# Patient Record
Sex: Female | Born: 1945 | Race: White | Hispanic: No | Smoking: Never smoker
Health system: Southern US, Community
[De-identification: ages and names within clinical notes are randomized; demographics above are authoritative.]

## PROBLEM LIST (undated history)

## (undated) DIAGNOSIS — M659 Unspecified synovitis and tenosynovitis, unspecified site: Secondary | ICD-10-CM

## (undated) DIAGNOSIS — K589 Irritable bowel syndrome without diarrhea: Secondary | ICD-10-CM

## (undated) DIAGNOSIS — R569 Unspecified convulsions: Secondary | ICD-10-CM

## (undated) DIAGNOSIS — H9190 Unspecified hearing loss, unspecified ear: Secondary | ICD-10-CM

## (undated) DIAGNOSIS — M1990 Unspecified osteoarthritis, unspecified site: Secondary | ICD-10-CM

## (undated) DIAGNOSIS — R197 Diarrhea, unspecified: Secondary | ICD-10-CM

## (undated) DIAGNOSIS — G459 Transient cerebral ischemic attack, unspecified: Secondary | ICD-10-CM

## (undated) DIAGNOSIS — M722 Plantar fascial fibromatosis: Secondary | ICD-10-CM

## (undated) DIAGNOSIS — D219 Benign neoplasm of connective and other soft tissue, unspecified: Secondary | ICD-10-CM

## (undated) DIAGNOSIS — K8020 Calculus of gallbladder without cholecystitis without obstruction: Secondary | ICD-10-CM

## (undated) DIAGNOSIS — F329 Major depressive disorder, single episode, unspecified: Secondary | ICD-10-CM

## (undated) DIAGNOSIS — Z889 Allergy status to unspecified drugs, medicaments and biological substances status: Secondary | ICD-10-CM

## (undated) DIAGNOSIS — K649 Unspecified hemorrhoids: Secondary | ICD-10-CM

## (undated) DIAGNOSIS — F32A Depression, unspecified: Secondary | ICD-10-CM

## (undated) DIAGNOSIS — E039 Hypothyroidism, unspecified: Secondary | ICD-10-CM

## (undated) DIAGNOSIS — I639 Cerebral infarction, unspecified: Secondary | ICD-10-CM

## (undated) DIAGNOSIS — N200 Calculus of kidney: Secondary | ICD-10-CM

## (undated) HISTORY — PX: JOINT REPLACEMENT: SHX530

## (undated) HISTORY — PX: WISDOM TOOTH EXTRACTION: SHX21

## (undated) HISTORY — PX: BUNIONECTOMY: SHX129

## (undated) HISTORY — DX: Transient cerebral ischemic attack, unspecified: G45.9

## (undated) HISTORY — PX: DILATION AND CURETTAGE OF UTERUS: SHX78

## (undated) HISTORY — PX: CYST EXCISION: SHX5701

## (undated) HISTORY — PX: HARDWARE REMOVAL: SHX979

## (undated) HISTORY — PX: TONSILLECTOMY: SUR1361

## (undated) HISTORY — DX: Unspecified convulsions: R56.9

## (undated) HISTORY — PX: COLONOSCOPY: SHX174

---

## 2005-01-05 ENCOUNTER — Ambulatory Visit: Payer: Self-pay | Admitting: Obstetrics and Gynecology

## 2006-02-02 ENCOUNTER — Ambulatory Visit: Payer: Self-pay | Admitting: Obstetrics and Gynecology

## 2006-06-01 ENCOUNTER — Ambulatory Visit: Payer: Self-pay | Admitting: Podiatry

## 2006-08-18 ENCOUNTER — Ambulatory Visit: Payer: Self-pay | Admitting: Obstetrics and Gynecology

## 2007-02-06 ENCOUNTER — Ambulatory Visit: Payer: Self-pay | Admitting: Obstetrics and Gynecology

## 2007-11-27 ENCOUNTER — Ambulatory Visit: Payer: Self-pay | Admitting: Unknown Physician Specialty

## 2008-02-22 ENCOUNTER — Ambulatory Visit: Payer: Self-pay | Admitting: Obstetrics and Gynecology

## 2009-02-25 ENCOUNTER — Ambulatory Visit: Payer: Self-pay | Admitting: Obstetrics and Gynecology

## 2010-03-03 ENCOUNTER — Ambulatory Visit: Payer: Self-pay | Admitting: Obstetrics and Gynecology

## 2011-03-05 ENCOUNTER — Ambulatory Visit: Payer: Self-pay | Admitting: Obstetrics and Gynecology

## 2011-08-03 ENCOUNTER — Ambulatory Visit: Payer: Self-pay | Admitting: Unknown Physician Specialty

## 2012-03-08 ENCOUNTER — Ambulatory Visit: Payer: Self-pay | Admitting: Obstetrics and Gynecology

## 2013-01-31 ENCOUNTER — Ambulatory Visit: Payer: Self-pay

## 2013-02-06 ENCOUNTER — Ambulatory Visit: Payer: Self-pay | Admitting: Family Medicine

## 2013-03-08 ENCOUNTER — Ambulatory Visit: Payer: Self-pay | Admitting: Family Medicine

## 2015-01-13 ENCOUNTER — Encounter (INDEPENDENT_AMBULATORY_CARE_PROVIDER_SITE_OTHER): Payer: Medicare Other | Admitting: Ophthalmology

## 2015-01-13 DIAGNOSIS — H33303 Unspecified retinal break, bilateral: Secondary | ICD-10-CM | POA: Diagnosis not present

## 2015-01-13 DIAGNOSIS — H4313 Vitreous hemorrhage, bilateral: Secondary | ICD-10-CM | POA: Diagnosis not present

## 2015-01-13 DIAGNOSIS — H4311 Vitreous hemorrhage, right eye: Secondary | ICD-10-CM | POA: Diagnosis not present

## 2015-01-13 DIAGNOSIS — H2513 Age-related nuclear cataract, bilateral: Secondary | ICD-10-CM | POA: Diagnosis not present

## 2015-01-20 ENCOUNTER — Ambulatory Visit (INDEPENDENT_AMBULATORY_CARE_PROVIDER_SITE_OTHER): Payer: Medicare Other | Admitting: Ophthalmology

## 2015-01-20 DIAGNOSIS — H33302 Unspecified retinal break, left eye: Secondary | ICD-10-CM

## 2015-01-30 ENCOUNTER — Ambulatory Visit (INDEPENDENT_AMBULATORY_CARE_PROVIDER_SITE_OTHER): Payer: Medicare Other | Admitting: Ophthalmology

## 2015-01-30 DIAGNOSIS — H33303 Unspecified retinal break, bilateral: Secondary | ICD-10-CM

## 2015-03-25 ENCOUNTER — Other Ambulatory Visit: Payer: Self-pay | Admitting: Family Medicine

## 2015-03-25 DIAGNOSIS — Z780 Asymptomatic menopausal state: Secondary | ICD-10-CM

## 2015-04-02 DIAGNOSIS — M810 Age-related osteoporosis without current pathological fracture: Secondary | ICD-10-CM | POA: Insufficient documentation

## 2015-04-02 DIAGNOSIS — K8020 Calculus of gallbladder without cholecystitis without obstruction: Secondary | ICD-10-CM | POA: Insufficient documentation

## 2015-06-02 ENCOUNTER — Ambulatory Visit (INDEPENDENT_AMBULATORY_CARE_PROVIDER_SITE_OTHER): Payer: Medicare Other | Admitting: Ophthalmology

## 2015-06-02 DIAGNOSIS — H43813 Vitreous degeneration, bilateral: Secondary | ICD-10-CM

## 2015-06-02 DIAGNOSIS — H2513 Age-related nuclear cataract, bilateral: Secondary | ICD-10-CM | POA: Diagnosis not present

## 2015-06-02 DIAGNOSIS — H33303 Unspecified retinal break, bilateral: Secondary | ICD-10-CM

## 2015-06-05 ENCOUNTER — Other Ambulatory Visit: Payer: Self-pay | Admitting: Family Medicine

## 2015-06-05 DIAGNOSIS — Z1231 Encounter for screening mammogram for malignant neoplasm of breast: Secondary | ICD-10-CM

## 2015-07-03 ENCOUNTER — Ambulatory Visit
Admission: RE | Admit: 2015-07-03 | Discharge: 2015-07-03 | Disposition: A | Discharge status: Home / Self Care | Payer: Medicare Other | Source: Ambulatory Visit | Attending: Family Medicine | Admitting: Family Medicine

## 2015-07-03 DIAGNOSIS — Z1231 Encounter for screening mammogram for malignant neoplasm of breast: Secondary | ICD-10-CM | POA: Diagnosis not present

## 2016-02-03 ENCOUNTER — Emergency Department
Admission: EM | Admit: 2016-02-03 | Discharge: 2016-02-03 | Disposition: A | Discharge status: Home / Self Care | Payer: Medicare Other | Attending: Emergency Medicine | Admitting: Emergency Medicine

## 2016-02-03 ENCOUNTER — Encounter: Payer: Self-pay | Admitting: Emergency Medicine

## 2016-02-03 ENCOUNTER — Emergency Department: Payer: Medicare Other

## 2016-02-03 DIAGNOSIS — Z7982 Long term (current) use of aspirin: Secondary | ICD-10-CM | POA: Diagnosis not present

## 2016-02-03 DIAGNOSIS — J189 Pneumonia, unspecified organism: Secondary | ICD-10-CM

## 2016-02-03 DIAGNOSIS — Z79899 Other long term (current) drug therapy: Secondary | ICD-10-CM | POA: Diagnosis not present

## 2016-02-03 DIAGNOSIS — J159 Unspecified bacterial pneumonia: Secondary | ICD-10-CM | POA: Insufficient documentation

## 2016-02-03 DIAGNOSIS — R05 Cough: Secondary | ICD-10-CM | POA: Diagnosis present

## 2016-02-03 LAB — BASIC METABOLIC PANEL
ANION GAP: 8 (ref 5–15)
BUN: 17 mg/dL (ref 6–20)
CHLORIDE: 107 mmol/L (ref 101–111)
CO2: 23 mmol/L (ref 22–32)
Calcium: 9 mg/dL (ref 8.9–10.3)
Creatinine, Ser: 0.69 mg/dL (ref 0.44–1.00)
GFR calc Af Amer: >60 mL/min (ref >60)
GLUCOSE: 152 mg/dL — AB (ref 65–99)
POTASSIUM: 2.9 mmol/L — AB (ref 3.5–5.1)
Sodium: 138 mmol/L (ref 135–145)

## 2016-02-03 LAB — POCT RAPID STREP A: Streptococcus, Group A Screen (Direct): NEGATIVE

## 2016-02-03 LAB — CBC
HEMATOCRIT: 39.6 % (ref 35.0–47.0)
HEMOGLOBIN: 13.3 g/dL (ref 12.0–16.0)
MCH: 30.4 pg (ref 26.0–34.0)
MCHC: 33.6 g/dL (ref 32.0–36.0)
MCV: 90.3 fL (ref 80.0–100.0)
PLATELETS: 249 10*3/uL (ref 150–440)
RBC: 4.39 MIL/uL (ref 3.80–5.20)
RDW: 14.1 % (ref 11.5–14.5)
WBC: 11.2 10*3/uL — AB (ref 3.6–11.0)

## 2016-02-03 LAB — RAPID INFLUENZA A&B ANTIGENS: Influenza A (ARMC): NEGATIVE

## 2016-02-03 LAB — RAPID INFLUENZA A&B ANTIGENS (ARMC ONLY): INFLUENZA B (ARMC): NEGATIVE

## 2016-02-03 MED ORDER — IPRATROPIUM-ALBUTEROL 0.5-2.5 (3) MG/3ML IN SOLN
3.0000 mL | Freq: Once | RESPIRATORY_TRACT | Status: AC
  Administered 2016-02-03: 3 mL via RESPIRATORY_TRACT
  Filled 2016-02-03: qty 3

## 2016-02-03 MED ORDER — ONDANSETRON 4 MG PO TBDP: 4.0000 mg | ORAL_TABLET | Freq: Once | ORAL | Status: DC

## 2016-02-03 MED ORDER — AZITHROMYCIN 250 MG PO TABS: 250.0000 mg | ORAL_TABLET | Freq: Every day | ORAL | Status: DC

## 2016-02-03 MED ORDER — AZITHROMYCIN 500 MG PO TABS
500.0000 mg | ORAL_TABLET | Freq: Once | ORAL | Status: AC
  Administered 2016-02-03: 500 mg via ORAL
  Filled 2016-02-03: qty 1

## 2016-02-03 MED ORDER — ONDANSETRON 4 MG PO TBDP
4.0000 mg | ORAL_TABLET | Freq: Once | ORAL | Status: AC
  Administered 2016-02-03: 4 mg via ORAL
  Filled 2016-02-03: qty 1

## 2016-02-03 NOTE — ED Notes (Signed)
Pt states she has been coughing and feeling poorly since last Thursday (5 days ago).  Pt has also been running a fever at home (tmax at home 101.9).  Pt went to ENT Dr. Tami Ribas this morning and he recommended she come to ED to be evaluated for pneumonia.  Pt not in any obvious signs of distress or shortness of breath.  Denies pain.

## 2016-02-03 NOTE — Discharge Instructions (Signed)
Please drink plenty of fluid to stay well-hydrated. Please return to the emergency department if you develop severe pain, shortness of breath, lightheadedness or fainting, fever, chest pain, or any other symptoms concerning to you. Community-Acquired Pneumonia, Adult Pneumonia is an infection of the lungs. There are different types of pneumonia. One type can develop while a person is in a hospital. A different type, called community-acquired pneumonia, develops in people who are not, or have not recently been, in the hospital or other health care facility.  CAUSES Pneumonia may be caused by bacteria, viruses, or funguses. Community-acquired pneumonia is often caused by Streptococcus pneumonia bacteria. These bacteria are often passed from one person to another by breathing in droplets from the cough or sneeze of an infected person. RISK FACTORS The condition is more likely to develop in:  People who havechronic diseases, such as chronic obstructive pulmonary disease (COPD), asthma, congestive heart failure, cystic fibrosis, diabetes, or kidney disease.  People who haveearly-stage or late-stage HIV.  People who havesickle cell disease.  People who havehad their spleen removed (splenectomy).  People who havepoor Human resources officer.  People who havemedical conditions that increase the risk of breathing in (aspirating) secretions their own mouth and nose.   People who havea weakened immune system (immunocompromised).  People who smoke.  People whotravel to areas where pneumonia-causing germs commonly exist.  People whoare around animal habitats or animals that have pneumonia-causing germs, including birds, bats, rabbits, cats, and farm animals. SYMPTOMS Symptoms of this condition include:  Adry cough.  A wet (productive) cough.  Fever.  Sweating.  Chest pain, especially when breathing deeply or coughing.  Rapid breathing or difficulty breathing.  Shortness of  breath.  Shaking chills.  Fatigue.  Muscle aches. DIAGNOSIS Your health care provider will take a medical history and perform a physical exam. You may also have other tests, including:  Imaging studies of your chest, including X-rays.  Tests to check your blood oxygen level and other blood gases.  Other tests on blood, mucus (sputum), fluid around your lungs (pleural fluid), and urine. If your pneumonia is severe, other tests may be done to identify the specific cause of your illness. TREATMENT The type of treatment that you receive depends on many factors, such as the cause of your pneumonia, the medicines you take, and other medical conditions that you have. For most adults, treatment and recovery from pneumonia may occur at home. In some cases, treatment must happen in a hospital. Treatment may include:  Antibiotic medicines, if the pneumonia was caused by bacteria.  Antiviral medicines, if the pneumonia was caused by a virus.  Medicines that are given by mouth or through an IV tube.  Oxygen.  Respiratory therapy. Although rare, treating severe pneumonia may include:  Mechanical ventilation. This is done if you are not breathing well on your own and you cannot maintain a safe blood oxygen level.  Thoracentesis. This procedureremoves fluid around one lung or both lungs to help you breathe better. HOME CARE INSTRUCTIONS  Take over-the-counter and prescription medicines only as told by your health care provider.  Only takecough medicine if you are losing sleep. Understand that cough medicine can prevent your body's natural ability to remove mucus from your lungs.  If you were prescribed an antibiotic medicine, take it as told by your health care provider. Do not stop taking the antibiotic even if you start to feel better.  Sleep in a semi-upright position at night. Try sleeping in a reclining chair, or place a  few pillows under your head.  Do not use tobacco products,  including cigarettes, chewing tobacco, and e-cigarettes. If you need help quitting, ask your health care provider.  Drink enough water to keep your urine clear or pale yellow. This will help to thin out mucus secretions in your lungs. PREVENTION There are ways that you can decrease your risk of developing community-acquired pneumonia. Consider getting a pneumococcal vaccine if:  You are older than 71 years of age.  You are older than 71 years of age and are undergoing cancer treatment, have chronic lung disease, or have other medical conditions that affect your immune system. Ask your health care provider if this applies to you. There are different types and schedules of pneumococcal vaccines. Ask your health care provider which vaccination option is best for you. You may also prevent community-acquired pneumonia if you take these actions:  Get an influenza vaccine every year. Ask your health care provider which type of influenza vaccine is best for you.  Go to the dentist on a regular basis.  Wash your hands often. Use hand sanitizer if soap and water are not available. SEEK MEDICAL CARE IF:  You have a fever.  You are losing sleep because you cannot control your cough with cough medicine. SEEK IMMEDIATE MEDICAL CARE IF:  You have worsening shortness of breath.  You have increased chest pain.  Your sickness becomes worse, especially if you are an older adult or have a weakened immune system.  You cough up blood.   This information is not intended to replace advice given to you by your health care provider. Make sure you discuss any questions you have with your health care provider.   Document Released: 10/25/2005 Document Revised: 07/16/2015 Document Reviewed: 02/19/2015 Elsevier Interactive Patient Education [employer].

## 2016-02-03 NOTE — ED Notes (Signed)
Pt states cough, fever, and feeling bad since Sat.

## 2016-02-03 NOTE — ED Provider Notes (Signed)
[facility] Emergency Department Provider Note  ____________________________________________  Time seen: Approximately 10:27 AM  I have reviewed the triage vital signs and the nursing notes.   HISTORY  Chief Complaint Cough and Fever    HPI [name] [name] is a 70 y.o. female without any underlying lung disease presenting from ENT clinic for cough and fever. Patient reports that the last 4 days she has had a fever as high as 101.9. She has had a productive cough associated with sore throat, and congestion. She is not had any nausea vomiting or diarrhea, no abdominal pain. No chest pain, palpitations lightheadedness or syncope. She does have some mild exertional dyspnea.   History reviewed. No pertinent past medical history.  There are no active problems to display for this patient.   Past Surgical History  Procedure Laterality Date  . Tonsillectomy      Current Outpatient Rx  Name  Route  Sig  Dispense  Refill  . aspirin 81 MG chewable tablet   Oral   Chew 1 tablet by mouth at bedtime.         . Azelastine-Fluticasone (DYMISTA) 137-50 MCG/ACT SUSP   Each Nare   Place 1 spray into both nostrils 2 (two) times daily.         . Calcium Carbonate-Vitamin D 600-400 MG-UNIT tablet   Oral   Take 1 tablet by mouth daily.         . cetirizine (ZYRTEC) 10 MG tablet   Oral   Take 1 tablet by mouth daily.         . cholecalciferol (VITAMIN D) 1000 units tablet   Oral   Take 1,000 Units by mouth daily.         . Coenzyme Q-10 200 MG CAPS   Oral   Take 1 capsule by mouth daily.         . Cyanocobalamin (VITAMIN B-12 CR) 1000 MCG TBCR   Oral   Take 1 tablet by mouth daily.         Marland Kitchen EPIPEN 2-PAK 0.3 MG/0.3ML SOAJ injection   Intramuscular   Inject 0.3 mg into the muscle as needed.      3     Dispense as written.   . levocetirizine (XYZAL) 5 MG tablet   Oral   Take 1 tablet by mouth at bedtime.         . NONFORMULARY OR  COMPOUNDED ITEM   Intramuscular   Inject into the muscle once a week. Compounded allergy shot.         . pyridOXINE (VITAMIN B-6) 25 MG tablet   Oral   Take 1 tablet by mouth daily.         . Red Yeast Rice Extract 600 MG CAPS   Oral   Take 1 capsule by mouth 2 (two) times daily.         Marland Kitchen azithromycin (ZITHROMAX) 250 MG tablet   Oral   Take 1 tablet (250 mg total) by mouth daily.   4 tablet   0   . ondansetron (ZOFRAN-ODT) 4 MG disintegrating tablet   Oral   Take 1 tablet (4 mg total) by mouth once.   20 tablet   0     Allergies Augmentin; Prednisone; Codeine; and Erythromycin  Family History  Problem Relation Age of Onset  . Breast cancer Maternal Grandfather 80    Social History Social History  Substance Use Topics  . Smoking status: Never Smoker   .  Smokeless tobacco: None  . Alcohol Use: No     Comment: occas    Review of Systems Constitutional: Positive fever. Positive general malaise. Negative lightheadedness or syncope. Eyes: No visual changes. No eye discharge. ENT: Positive sore throat. Positive congestion or rhinorrhea. Cardiovascular: Denies chest pain. Denies palpitations. Respiratory: Positive exertional shortness of breath.  Positive cough. Gastrointestinal: No abdominal pain.  No nausea, no vomiting.  No diarrhea.  No constipation. Genitourinary: Negative for dysuria. Musculoskeletal: Negative for back pain. Skin: Negative for rash. Neurological: Negative for headaches. No focal numbness, tingling or weakness.   10-point ROS otherwise negative.  ____________________________________________   PHYSICAL EXAM:  VITAL SIGNS: ED Triage Vitals  Enc Vitals Group     BP 02/03/16 0919 147/70 mmHg     Pulse Rate 02/03/16 0919 91     Resp 02/03/16 0919 18     Temp 02/03/16 0919 98.2 F (36.8 C)     Temp Source 02/03/16 0919 Oral     SpO2 02/03/16 0919 100 %     Weight 02/03/16 0919 164 lb (74.39 kg)     Height 02/03/16 0919 5\' 4"   (1.626 m)     Head Cir --      Peak Flow --      Pain Score --      Pain Loc --      Pain Edu? --      Excl. in [place]? --     Constitutional: Patient is alert and oriented and answering questions appropriately. She is in no acute distress but mildly uncomfortable appearing.  Eyes: Conjunctivae are normal.  EOMI. No scleral icterus. No eye discharge. Head: Atraumatic. Nose: Positive congestion without rhinorrhea. Mouth/Throat: Mucous membranes are moist. No posterior pharyngeal erythema, tonsils are absent. Neck: No stridor.  Supple.  No meningismus. Cardiovascular: Normal rate, regular rhythm. No murmurs, rubs or gallops.  Respiratory: Patient is mildly tachypnea without accessory muscle use or retractions. She has some rales in the left but otherwise no wheezing or rhonchi.  Gastrointestinal: Soft, nontender and nondistended.  No guarding or rebound.  No peritoneal signs. Musculoskeletal: No LE edema. No ttp in the calves or palpable cords.  Negative Homan's sign. Neurologic:  A&Ox3.  Speech is clear.  Face and smile are symmetric.  EOMI.  Moves all extremities well. Skin:  Skin is warm, dry and intact. No rash noted. Psychiatric: Mood and affect are normal. Speech and behavior are normal.  Normal judgement.  ____________________________________________   LABS (all labs ordered are listed, but only abnormal results are displayed)  Labs Reviewed  CBC - Abnormal; Notable for the following:    WBC 11.2 (*)    All other components within normal limits  BASIC METABOLIC PANEL - Abnormal; Notable for the following:    Potassium 2.9 (*)    Glucose, Bld 152 (*)    All other components within normal limits  RAPID INFLUENZA A&B ANTIGENS ([facility] ONLY)  POCT RAPID STREP A   ____________________________________________  EKG  ED ECG REPORT I, Eula Listen, the attending physician, personally viewed and interpreted this ECG.   Date: 02/03/2016  EKG Time: 1221  Rate: 94  Rhythm:  normal sinus rhythm  Axis: Leftward  Intervals:Prolonged QTC  ST&T Change: No ST elevation  ____________________________________________  RADIOLOGY  Dg Chest 2 View  02/03/2016  CLINICAL DATA:  Cough and fever for several days EXAM: CHEST  2 VIEW COMPARISON:  02/06/2013 FINDINGS: Cardiac shadow is within normal limits. The lungs are well aerated bilaterally. Mild  left lower lobe infiltrate is seen. No associated effusion is noted. No bony abnormality is noted. IMPRESSION: Mild left lower lobe infiltrate. Electronically Signed   By: Inez Catalina M.D.   On: 02/03/2016 10:12    ____________________________________________   PROCEDURES  Procedure(s) performed: None  Critical Care performed: No ____________________________________________   INITIAL IMPRESSION / ASSESSMENT AND PLAN / ED COURSE  Pertinent labs & imaging results that were available during my care of the patient were reviewed by me and considered in my medical decision making (see chart for details).  70 y.o. female presenting with 4 days of fever, cough and has an abnormal left lower lung base on exam. I am concerned the patient may have pneumonia, but we will also check her for influenza. At rest, she has a normal oxygen saturation and no significant respiratory distress, so she is able to maintain her oxygen saturations with exertion, she will be able to be treated for pneumonia as an outpatient with close PMD follow-up. It is very unlikely that her symptoms come from an acute cardiac cause.  ----------------------------------------- 10:30 AM on 02/03/2016 -----------------------------------------  On chest x-ray, the patient does have a left lower lobe pneumonia.  ----------------------------------------- 12:09 PM on 02/03/2016 -----------------------------------------  She does continue to maintain normal oxygen saturation. She denied had a long conversation about the sensitivities that she has had antibiotics in  the past, but she states that she does well with azithromycin. We will plan to discharge her home with this as well as Zofran to take if she develops nausea. She will follow-up with her primary care physician. I did call Dr. Tami Ribas back to let him know the results of her studies.  ____________________________________________  FINAL CLINICAL IMPRESSION(S) / ED DIAGNOSES  Final diagnoses:  CAP (community acquired pneumonia)      NEW MEDICATIONS STARTED DURING THIS VISIT:  New Prescriptions   AZITHROMYCIN (ZITHROMAX) 250 MG TABLET    Take 1 tablet (250 mg total) by mouth daily.   ONDANSETRON (ZOFRAN-ODT) 4 MG DISINTEGRATING TABLET    Take 1 tablet (4 mg total) by mouth once.     Eula Listen, MD 02/03/16 1551

## 2016-02-24 ENCOUNTER — Ambulatory Visit
Admission: RE | Admit: 2016-02-24 | Discharge: 2016-02-24 | Disposition: A | Discharge status: Home / Self Care | Payer: Medicare Other | Source: Ambulatory Visit | Attending: Family Medicine | Admitting: Family Medicine

## 2016-02-24 ENCOUNTER — Other Ambulatory Visit: Payer: Self-pay | Admitting: Family Medicine

## 2016-02-24 DIAGNOSIS — J181 Lobar pneumonia, unspecified organism: Principal | ICD-10-CM

## 2016-02-24 DIAGNOSIS — Z09 Encounter for follow-up examination after completed treatment for conditions other than malignant neoplasm: Secondary | ICD-10-CM | POA: Diagnosis present

## 2016-02-24 DIAGNOSIS — J189 Pneumonia, unspecified organism: Secondary | ICD-10-CM

## 2016-02-24 DIAGNOSIS — Z8701 Personal history of pneumonia (recurrent): Secondary | ICD-10-CM | POA: Insufficient documentation

## 2016-06-02 ENCOUNTER — Ambulatory Visit (INDEPENDENT_AMBULATORY_CARE_PROVIDER_SITE_OTHER): Payer: Medicare Other | Admitting: Ophthalmology

## 2016-06-17 ENCOUNTER — Ambulatory Visit (INDEPENDENT_AMBULATORY_CARE_PROVIDER_SITE_OTHER): Payer: Medicare Other | Admitting: Ophthalmology

## 2016-06-17 DIAGNOSIS — H2513 Age-related nuclear cataract, bilateral: Secondary | ICD-10-CM

## 2016-06-17 DIAGNOSIS — H33303 Unspecified retinal break, bilateral: Secondary | ICD-10-CM | POA: Diagnosis not present

## 2016-06-17 DIAGNOSIS — H43813 Vitreous degeneration, bilateral: Secondary | ICD-10-CM

## 2016-08-24 ENCOUNTER — Other Ambulatory Visit: Payer: Self-pay | Admitting: Family Medicine

## 2016-08-24 DIAGNOSIS — Z1231 Encounter for screening mammogram for malignant neoplasm of breast: Secondary | ICD-10-CM

## 2016-09-24 ENCOUNTER — Ambulatory Visit
Admission: RE | Admit: 2016-09-24 | Discharge: 2016-09-24 | Disposition: A | Discharge status: Home / Self Care | Payer: Medicare Other | Source: Ambulatory Visit | Attending: Family Medicine | Admitting: Family Medicine

## 2016-09-24 DIAGNOSIS — Z1231 Encounter for screening mammogram for malignant neoplasm of breast: Secondary | ICD-10-CM | POA: Insufficient documentation

## 2017-09-21 DIAGNOSIS — E559 Vitamin D deficiency, unspecified: Secondary | ICD-10-CM | POA: Insufficient documentation

## 2017-10-13 ENCOUNTER — Other Ambulatory Visit: Payer: Self-pay | Admitting: Family Medicine

## 2017-10-13 DIAGNOSIS — Z1231 Encounter for screening mammogram for malignant neoplasm of breast: Secondary | ICD-10-CM

## 2017-11-25 ENCOUNTER — Ambulatory Visit
Admission: RE | Admit: 2017-11-25 | Discharge: 2017-11-25 | Disposition: A | Discharge status: Home / Self Care | Payer: Medicare Other | Source: Ambulatory Visit | Attending: Family Medicine | Admitting: Family Medicine

## 2017-11-25 DIAGNOSIS — Z1231 Encounter for screening mammogram for malignant neoplasm of breast: Secondary | ICD-10-CM | POA: Diagnosis present

## 2018-01-13 DIAGNOSIS — K580 Irritable bowel syndrome with diarrhea: Secondary | ICD-10-CM | POA: Insufficient documentation

## 2018-04-07 ENCOUNTER — Encounter: Payer: Self-pay | Admitting: *Deleted

## 2018-04-10 ENCOUNTER — Ambulatory Visit: Payer: Medicare Other | Admitting: Anesthesiology

## 2018-04-10 ENCOUNTER — Ambulatory Visit
Admission: RE | Admit: 2018-04-10 | Discharge: 2018-04-10 | Disposition: A | Discharge status: Home / Self Care | Payer: Medicare Other | Source: Ambulatory Visit | Attending: Unknown Physician Specialty | Admitting: Unknown Physician Specialty

## 2018-04-10 ENCOUNTER — Encounter
Admission: RE | Disposition: A | Discharge status: Home / Self Care | Payer: Self-pay | Source: Ambulatory Visit | Attending: Unknown Physician Specialty

## 2018-04-10 DIAGNOSIS — Z79899 Other long term (current) drug therapy: Secondary | ICD-10-CM | POA: Insufficient documentation

## 2018-04-10 DIAGNOSIS — F329 Major depressive disorder, single episode, unspecified: Secondary | ICD-10-CM | POA: Insufficient documentation

## 2018-04-10 DIAGNOSIS — E039 Hypothyroidism, unspecified: Secondary | ICD-10-CM | POA: Diagnosis not present

## 2018-04-10 DIAGNOSIS — K635 Polyp of colon: Secondary | ICD-10-CM | POA: Diagnosis not present

## 2018-04-10 DIAGNOSIS — Z7982 Long term (current) use of aspirin: Secondary | ICD-10-CM | POA: Insufficient documentation

## 2018-04-10 DIAGNOSIS — K640 First degree hemorrhoids: Secondary | ICD-10-CM | POA: Diagnosis not present

## 2018-04-10 DIAGNOSIS — Z1211 Encounter for screening for malignant neoplasm of colon: Secondary | ICD-10-CM | POA: Diagnosis present

## 2018-04-10 HISTORY — DX: Benign neoplasm of connective and other soft tissue, unspecified: D21.9

## 2018-04-10 HISTORY — DX: Plantar fascial fibromatosis: M72.2

## 2018-04-10 HISTORY — DX: Unspecified hearing loss, unspecified ear: H91.90

## 2018-04-10 HISTORY — DX: Synovitis and tenosynovitis, unspecified: M65.9

## 2018-04-10 HISTORY — DX: Unspecified synovitis and tenosynovitis, unspecified site: M65.90

## 2018-04-10 HISTORY — DX: Unspecified osteoarthritis, unspecified site: M19.90

## 2018-04-10 HISTORY — DX: Hypothyroidism, unspecified: E03.9

## 2018-04-10 HISTORY — DX: Unspecified hemorrhoids: K64.9

## 2018-04-10 HISTORY — DX: Irritable bowel syndrome, unspecified: K58.9

## 2018-04-10 HISTORY — PX: COLONOSCOPY WITH PROPOFOL: SHX5780

## 2018-04-10 HISTORY — DX: Allergy status to unspecified drugs, medicaments and biological substances: Z88.9

## 2018-04-10 HISTORY — DX: Diarrhea, unspecified: R19.7

## 2018-04-10 HISTORY — DX: Calculus of gallbladder without cholecystitis without obstruction: K80.20

## 2018-04-10 HISTORY — DX: Depression, unspecified: F32.A

## 2018-04-10 HISTORY — DX: Major depressive disorder, single episode, unspecified: F32.9

## 2018-04-10 HISTORY — DX: Calculus of kidney: N20.0

## 2018-04-10 SURGERY — COLONOSCOPY WITH PROPOFOL
Anesthesia: General

## 2018-04-10 MED ORDER — SODIUM CHLORIDE 0.9 % IV SOLN: INTRAVENOUS | Status: DC

## 2018-04-10 MED ORDER — LIDOCAINE HCL (PF) 2 % IJ SOLN
INTRAMUSCULAR | Status: DC | PRN
  Administered 2018-04-10: 60 mg

## 2018-04-10 MED ORDER — PROPOFOL 500 MG/50ML IV EMUL
INTRAVENOUS | Status: DC | PRN
  Administered 2018-04-10: 50 ug/kg/min via INTRAVENOUS

## 2018-04-10 MED ORDER — MIDAZOLAM HCL 2 MG/2ML IJ SOLN
INTRAMUSCULAR | Status: AC
  Filled 2018-04-10: qty 2

## 2018-04-10 MED ORDER — MIDAZOLAM HCL 5 MG/5ML IJ SOLN
INTRAMUSCULAR | Status: DC | PRN
Start: 2018-04-10 — End: 2018-04-10
  Administered 2018-04-10: 2 mg via INTRAVENOUS

## 2018-04-10 MED ORDER — FENTANYL CITRATE (PF) 100 MCG/2ML IJ SOLN
INTRAMUSCULAR | Status: DC | PRN
  Administered 2018-04-10: 25 ug via INTRAVENOUS

## 2018-04-10 MED ORDER — ATROPINE SULFATE 0.4 MG/ML IJ SOLN
INTRAMUSCULAR | Status: AC
  Filled 2018-04-10: qty 1

## 2018-04-10 MED ORDER — PROPOFOL 10 MG/ML IV BOLUS
INTRAVENOUS | Status: DC | PRN
  Administered 2018-04-10: 20 mg via INTRAVENOUS

## 2018-04-10 MED ORDER — GLYCOPYRROLATE 0.2 MG/ML IJ SOLN
INTRAMUSCULAR | Status: DC | PRN
  Administered 2018-04-10 (×2): 0.2 mg via INTRAVENOUS

## 2018-04-10 MED ORDER — SODIUM CHLORIDE 0.9 % IV SOLN
INTRAVENOUS | Status: DC
  Administered 2018-04-10: 1000 mL via INTRAVENOUS

## 2018-04-10 MED ORDER — GLYCOPYRROLATE 0.2 MG/ML IJ SOLN
INTRAMUSCULAR | Status: AC
  Filled 2018-04-10: qty 2

## 2018-04-10 MED ORDER — FENTANYL CITRATE (PF) 100 MCG/2ML IJ SOLN
INTRAMUSCULAR | Status: AC
  Filled 2018-04-10: qty 2

## 2018-04-10 MED ORDER — LIDOCAINE HCL (PF) 2 % IJ SOLN
INTRAMUSCULAR | Status: AC
  Filled 2018-04-10: qty 10

## 2018-04-10 NOTE — Transfer of Care (Signed)
Immediate Anesthesia Transfer of Care Note  Patient: [name] [name]  Procedure(s) Performed: COLONOSCOPY WITH PROPOFOL (N/A )  Patient Location: PACU  Anesthesia Type:General  Level of Consciousness: sedated  Airway & Oxygen Therapy: Patient Spontanous Breathing and Patient connected to nasal cannula oxygen  Post-op Assessment: Report given to RN and Post -op Vital signs reviewed and stable  Post vital signs: Reviewed and stable  Last Vitals:  Vitals Value Taken Time  BP 120/70 04/10/2018 10:30 AM  Temp 36.4 C 04/10/2018 10:30 AM  Pulse 95 04/10/2018 10:31 AM  Resp 20 04/10/2018 10:31 AM  SpO2 100 % 04/10/2018 10:31 AM  Vitals shown include unvalidated device data.  Last Pain:  Vitals:   04/10/18 1030  TempSrc: Tympanic  PainSc: Asleep         Complications: No apparent anesthesia complications

## 2018-04-10 NOTE — Anesthesia Post-op Follow-up Note (Signed)
Anesthesia QCDR form completed.        

## 2018-04-10 NOTE — Anesthesia Preprocedure Evaluation (Signed)
Anesthesia Evaluation  Patient identified by MRN, date of birth, ID band Patient awake    Reviewed: Allergy & Precautions, H&P , NPO status , Patient's Chart, lab work & pertinent test results, reviewed documented beta blocker date and time   Airway Mallampati: II   Neck ROM: full    Dental  (+) Poor Dentition   Pulmonary neg pulmonary ROS,    Pulmonary exam normal        Cardiovascular negative cardio ROS Normal cardiovascular exam Rhythm:regular Rate:Normal     Neuro/Psych PSYCHIATRIC DISORDERS Depression negative neurological ROS  negative psych ROS   GI/Hepatic negative GI ROS, Neg liver ROS,   Endo/Other  negative endocrine ROSHypothyroidism   Renal/GU Renal diseasenegative Renal ROS  negative genitourinary   Musculoskeletal   Abdominal   Peds  Hematology negative hematology ROS (+)   Anesthesia Other Findings Past Medical History: No date: Allergic genetic state No date: Arthritis No date: Cholelithiasis No date: Depression No date: Diarrhea No date: Fibroids No date: Hearing loss No date: Hemorrhoids No date: Hypothyroidism No date: IBS (irritable bowel syndrome) No date: Kidney stones No date: Osteoarthrosis No date: Plantar fasciitis No date: Synovitis Past Surgical History: No date: BUNIONECTOMY No date: COLONOSCOPY No date: CYST EXCISION     Comment:  mouth No date: DILATION AND CURETTAGE OF UTERUS No date: HARDWARE REMOVAL No date: JOINT REPLACEMENT No date: TONSILLECTOMY No date: WISDOM TOOTH EXTRACTION BMI    Body Mass Index:  26.50 kg/m     Reproductive/Obstetrics negative OB ROS                             Anesthesia Physical Anesthesia Plan  ASA: II  Anesthesia Plan: General   Post-op Pain Management:    Induction:   PONV Risk Score and Plan:   Airway Management Planned:   Additional Equipment:   Intra-op Plan:   Post-operative Plan:    Informed Consent: I have reviewed the patients History and Physical, chart, labs and discussed the procedure including the risks, benefits and alternatives for the proposed anesthesia with the patient or authorized representative who has indicated his/her understanding and acceptance.   Dental Advisory Given  Plan Discussed with: CRNA  Anesthesia Plan Comments:         Anesthesia Quick Evaluation

## 2018-04-10 NOTE — H&P (Signed)
Primary Care Physician:  [name], MD Primary Gastroenterologist:  Dr. Vira Agar  Pre-Procedure History & Physical: HPI:  [name] [name] is a 72 y.o. female is here for an colonoscopy.  Done for screening   Past Medical History:  Diagnosis Date  . Allergic genetic state   . Arthritis   . Cholelithiasis   . Depression   . Diarrhea   . Fibroids   . Hearing loss   . Hemorrhoids   . Hypothyroidism   . IBS (irritable bowel syndrome)   . Kidney stones   . Osteoarthrosis   . Plantar fasciitis   . Synovitis     Past Surgical History:  Procedure Laterality Date  . BUNIONECTOMY    . COLONOSCOPY    . CYST EXCISION     mouth  . DILATION AND CURETTAGE OF UTERUS    . HARDWARE REMOVAL    . JOINT REPLACEMENT    . TONSILLECTOMY    . WISDOM TOOTH EXTRACTION      Prior to Admission medications   Medication Sig Start Date End Date Taking? Authorizing Provider  aspirin 81 MG chewable tablet Chew 1 tablet by mouth at bedtime.   Yes [provider]  Azelastine-Fluticasone (DYMISTA) 137-50 MCG/ACT SUSP Place 1 spray into both nostrils 2 (two) times daily. 03/19/15  Yes [provider]  azithromycin (ZITHROMAX) 250 MG tablet Take 1 tablet (250 mg total) by mouth daily. 02/03/16  Yes Eula Listen, MD  cetirizine (ZYRTEC) 10 MG tablet Take 1 tablet by mouth daily.   Yes [provider]  cholecalciferol (VITAMIN D) 1000 units tablet Take 1,000 Units by mouth daily.   Yes [provider]  Coenzyme Q-10 200 MG CAPS Take 1 capsule by mouth daily.   Yes [provider]  Cyanocobalamin (VITAMIN B-12 CR) 1000 MCG TBCR Take 1 tablet by mouth daily.   Yes [provider]  EPIPEN 2-PAK 0.3 MG/0.3ML SOAJ injection Inject 0.3 mg into the muscle as needed. 12/10/15  Yes [provider]  lactobacillus acidophilus (BACID) TABS tablet Take 2 tablets by mouth 3 (three) times daily.   Yes [provider]  levocetirizine  (XYZAL) 5 MG tablet Take 1 tablet by mouth at bedtime.   Yes [provider]  MISC NATURAL PRODUCT OP Apply to eye.   Yes [provider]  NONFORMULARY OR COMPOUNDED ITEM Inject into the muscle once a week. Compounded allergy shot.   Yes [provider]  ondansetron (ZOFRAN-ODT) 4 MG disintegrating tablet Take 1 tablet (4 mg total) by mouth once. 02/03/16  Yes Eula Listen, MD  pyridOXINE (VITAMIN B-6) 25 MG tablet Take 1 tablet by mouth daily.   Yes [provider]  Red Yeast Rice Extract 600 MG CAPS Take 1 capsule by mouth 2 (two) times daily.   Yes [provider]  Calcium Carbonate-Vitamin D 600-400 MG-UNIT tablet Take 1 tablet by mouth daily. 04/02/15 04/01/16  [provider]    Allergies as of 02/22/2018 - Review Complete 02/03/2016  Allergen Reaction Noted  . Augmentin [amoxicillin-pot clavulanate] Nausea And Vomiting 02/03/2016  . Prednisone Nausea And Vomiting 02/03/2016  . Codeine  02/03/2016  . Erythromycin  02/03/2016    Family History  Problem Relation Age of Onset  . Breast cancer Maternal Grandmother        Late 37's    Social History   Socioeconomic History  . Marital status: Widowed    Spouse name: Not on file  . Number of  children: Not on file  . Years of education: Not on file  . Highest education level: Not on file  Occupational History  . Not on file  Social Needs  . Financial resource strain: Not on file  . Food insecurity:    Worry: Not on file    Inability: Not on file  . Transportation needs:    Medical: Not on file    Non-medical: Not on file  Tobacco Use  . Smoking status: Never Smoker  . Smokeless tobacco: Never Used  Substance and Sexual Activity  . Alcohol use: Yes    Alcohol/week: 0.6 oz    Types: 1 Glasses of wine per week    Comment: occas  . Drug use: Never  . Sexual activity: Yes  Lifestyle  . Physical activity:    Days per week: Not on file    Minutes per session: Not  on file  . Stress: Not on file  Relationships  . Social connections:    Talks on phone: Not on file    Gets together: Not on file    Attends religious service: Not on file    Active member of club or organization: Not on file    Attends meetings of clubs or organizations: Not on file    Relationship status: Not on file  . Intimate partner violence:    Fear of current or ex partner: Not on file    Emotionally abused: Not on file    Physically abused: Not on file    Forced sexual activity: Not on file  Other Topics Concern  . Not on file  Social History Narrative  . Not on file    Review of Systems: See HPI, otherwise negative ROS  Physical Exam: BP 128/72   Pulse 68   Temp (!) 96.6 F (35.9 C) (Tympanic)   Resp 20   Ht 5' 3.5" (1.613 m)   Wt 68.9 kg (152 lb)   SpO2 99%   BMI 26.50 kg/m  General:   Alert,  pleasant and cooperative in NAD Head:  Normocephalic and atraumatic. Neck:  Supple; no masses or thyromegaly. Lungs:  Clear throughout to auscultation.    Heart:  Regular rate and rhythm. Abdomen:  Soft, nontender and nondistended. Normal bowel sounds, without guarding, and without rebound.   Neurologic:  Alert and  oriented x4;  grossly normal neurologically.  Impression/Plan: [name] is here for an colonoscopy to be performed for screening.  Risks, benefits, limitations, and alternatives regarding  colonoscopy have been reviewed with the patient.  Questions have been answered.  All parties agreeable.   Gaylyn Cheers, MD  04/10/2018, 10:06 AM

## 2018-04-10 NOTE — Op Note (Signed)
[facility] Patient Name: [name] [name] Procedure Date: 04/10/2018 9:53 AM MRN: [id number] Account #: [id number] Date of Birth: October 06, 1946 Admit Type: Outpatient Age: 72 Room: [facility] ENDO ROOM 3 Gender: Female Note Status: Finalized Procedure:            Colonoscopy Indications:          Screening for colorectal malignant neoplasm Providers:            [name], MD Referring MD:         Rubbie Battiest. Iona Beard MD, MD (Referring MD) Medicines:            Propofol per Anesthesia Complications:        No immediate complications. Procedure:            Pre-Anesthesia Assessment:                       - After reviewing the risks and benefits, the patient                        was deemed in satisfactory condition to undergo the                        procedure.                       After obtaining informed consent, the colonoscope was                        passed under direct vision. Throughout the procedure,                        the patient's blood pressure, pulse, and oxygen                        saturations were monitored continuously. The                        Colonoscope was introduced through the anus and                        advanced to the the cecum, identified by appendiceal                        orifice and ileocecal valve. The colonoscopy was                        performed without difficulty. The patient tolerated the                        procedure well. The quality of the bowel preparation                        was excellent. Findings:      A diminutive polyp was found in the cecum. The polyp was sessile. The       polyp was removed with a jumbo cold forceps. Resection and retrieval       were complete.      Internal hemorrhoids were found during endoscopy. The hemorrhoids were       small and Grade I (internal hemorrhoids that do not prolapse).      The exam  was otherwise without abnormality. Impression:           - One  diminutive polyp in the cecum, removed with a                        jumbo cold forceps. Resected and retrieved.                       - Internal hemorrhoids.                       - The examination was otherwise normal. Recommendation:       - Await pathology results. [name], MD 04/10/2018 10:29:55 AM This report has been signed electronically. Number of Addenda: 0 Note Initiated On: 04/10/2018 9:53 AM Scope Withdrawal Time: 0 hours 7 minutes 18 seconds  Total Procedure Duration: 0 hours 14 minutes 54 seconds       [facility]

## 2018-04-10 NOTE — Anesthesia Postprocedure Evaluation (Signed)
Anesthesia Post Note  Patient: Niah Heinle Kerwin  Procedure(s) Performed: COLONOSCOPY WITH PROPOFOL (N/A )  Patient location during evaluation: PACU Anesthesia Type: General Level of consciousness: awake and alert Pain management: pain level controlled Vital Signs Assessment: post-procedure vital signs reviewed and stable Respiratory status: spontaneous breathing, nonlabored ventilation, respiratory function stable and patient connected to nasal cannula oxygen Cardiovascular status: blood pressure returned to baseline and stable Postop Assessment: no apparent nausea or vomiting Anesthetic complications: no     Last Vitals:  Vitals:   04/10/18 1040 04/10/18 1050  BP: 135/77 123/82  Pulse: 90 82  Resp: 19 12  Temp: (!) 36.4 C (!) 36.4 C  SpO2: 100% 99%    Last Pain:  Vitals:   04/10/18 1050  TempSrc: Tympanic  PainSc: 0-No pain                 Molli Barrows

## 2018-04-11 LAB — SURGICAL PATHOLOGY

## 2018-04-13 ENCOUNTER — Encounter: Payer: Self-pay | Admitting: Unknown Physician Specialty

## 2018-11-10 ENCOUNTER — Other Ambulatory Visit: Payer: Self-pay | Admitting: Family Medicine

## 2018-11-10 DIAGNOSIS — Z1231 Encounter for screening mammogram for malignant neoplasm of breast: Secondary | ICD-10-CM

## 2018-12-01 ENCOUNTER — Ambulatory Visit
Admission: RE | Admit: 2018-12-01 | Discharge: 2018-12-01 | Disposition: A | Discharge status: Home / Self Care | Payer: Medicare Other | Source: Ambulatory Visit | Attending: Family Medicine | Admitting: Family Medicine

## 2018-12-01 DIAGNOSIS — Z1231 Encounter for screening mammogram for malignant neoplasm of breast: Secondary | ICD-10-CM | POA: Diagnosis present

## 2019-03-28 ENCOUNTER — Encounter: Payer: Self-pay | Admitting: Urology

## 2019-03-28 ENCOUNTER — Other Ambulatory Visit: Payer: Self-pay | Admitting: Family Medicine

## 2019-03-28 ENCOUNTER — Ambulatory Visit: Payer: Medicare Other | Admitting: Urology

## 2019-03-28 ENCOUNTER — Other Ambulatory Visit (HOSPITAL_COMMUNITY): Payer: Self-pay | Admitting: Family Medicine

## 2019-03-28 ENCOUNTER — Ambulatory Visit
Admission: RE | Admit: 2019-03-28 | Discharge: 2019-03-28 | Disposition: A | Discharge status: Home / Self Care | Payer: Medicare Other | Source: Ambulatory Visit | Attending: Family Medicine | Admitting: Family Medicine

## 2019-03-28 ENCOUNTER — Other Ambulatory Visit: Payer: Self-pay

## 2019-03-28 VITALS — BP 162/85 | HR 120 | Ht 63.0 in | Wt 162.0 lb

## 2019-03-28 DIAGNOSIS — N200 Calculus of kidney: Secondary | ICD-10-CM | POA: Insufficient documentation

## 2019-03-28 DIAGNOSIS — R319 Hematuria, unspecified: Secondary | ICD-10-CM

## 2019-03-28 DIAGNOSIS — N201 Calculus of ureter: Secondary | ICD-10-CM | POA: Diagnosis not present

## 2019-03-28 DIAGNOSIS — R109 Unspecified abdominal pain: Secondary | ICD-10-CM

## 2019-03-28 DIAGNOSIS — R350 Frequency of micturition: Secondary | ICD-10-CM | POA: Diagnosis not present

## 2019-03-28 DIAGNOSIS — N1330 Unspecified hydronephrosis: Secondary | ICD-10-CM

## 2019-03-28 DIAGNOSIS — H9193 Unspecified hearing loss, bilateral: Secondary | ICD-10-CM | POA: Insufficient documentation

## 2019-03-28 LAB — MICROSCOPIC EXAMINATION: Bacteria, UA: NONE SEEN

## 2019-03-28 LAB — URINALYSIS, COMPLETE
Bilirubin, UA: NEGATIVE
Glucose, UA: NEGATIVE
Leukocytes,UA: NEGATIVE
Nitrite, UA: NEGATIVE
Protein,UA: NEGATIVE
Specific Gravity, UA: >1.03 — ABNORMAL HIGH (ref 1.005–1.030)
Urobilinogen, Ur: 0.2 mg/dL (ref 0.2–1.0)
pH, UA: 5.5 (ref 5.0–7.5)

## 2019-03-28 NOTE — Progress Notes (Signed)
03/28/2019 4:25 PM   [name] [name] 1946/02/07 [id number]  Referring provider: [name], MD [name] [name], [place] [address]  Chief Complaint  Patient presents with  . Nephrolithiasis    New Patient    HPI: 73 year old female seen as a emergent referral today after being seen earlier today at her primary care's office with left lower quadrant pain/urgency and frequency found to have left distal ureteral calculi.  Personal history of kidney stones.  She is passed numerous spontaneously without difficulty.  She seen Dr. Bernardo [name] in the remote past but never required surgical intervention for stones.  The past 6+ weeks, she is experienced intermittent episodes of left lower quadrant pain, along with urinary symptoms including urgency frequency and bladder pressure.  These come and go.  Today is worse than other days.  She has been seen both virtually and then also physically today by her PCP who ultimately ordered a CT stone protocol demonstrating is 6 and 3 mm adjacent left distal ureteral calculi which are obstructing with proximal hydroureteronephrosis.  She has no additional upper tract stone burden.  In her primary's office, her urine was negative for any signs of infection however an antibiotic was sent to the pharmacy as a precaution which she has not yet filled.  She denies any fevers or chills.  She denies any overt dysuria.  No significant nausea or vomiting.   PMH: Past Medical History:  Diagnosis Date  . Allergic genetic state   . Arthritis   . Cholelithiasis   . Depression   . Diarrhea   . Fibroids   . Hearing loss   . Hemorrhoids   . Hypothyroidism   . IBS (irritable bowel syndrome)   . Kidney stones   . Osteoarthrosis   . Plantar fasciitis   . Synovitis     Surgical History: Past Surgical History:  Procedure Laterality Date  . BUNIONECTOMY    . COLONOSCOPY    . COLONOSCOPY WITH PROPOFOL N/A 04/10/2018   Procedure: COLONOSCOPY WITH  PROPOFOL;  Surgeon: [name] Silvas, MD;  Location: [facility] ENDOSCOPY;  Service: Endoscopy;  Laterality: N/A;  . CYST EXCISION     mouth  . DILATION AND CURETTAGE OF UTERUS    . HARDWARE REMOVAL    . JOINT REPLACEMENT    . TONSILLECTOMY    . WISDOM TOOTH EXTRACTION      Home Medications:  Allergies as of 03/28/2019      Reactions   Augmentin [amoxicillin-pot Clavulanate] Nausea And Vomiting   Prednisone Nausea And Vomiting   Codeine    Other reaction(s): Unknown   Erythromycin    Other reaction(s): Unknown   Levofloxacin    Omnicef [cefdinir]       Medication List       Accurate as of Mar 28, 2019  4:25 PM. If you have any questions, ask your nurse or doctor.        STOP taking these medications   ondansetron 4 MG disintegrating tablet Commonly known as:  ZOFRAN-ODT Stopped by:  [name] Espy, MD     TAKE these medications   aspirin 81 MG chewable tablet Chew 1 tablet by mouth at bedtime.   azithromycin 250 MG tablet Commonly known as:  ZITHROMAX Take 1 tablet (250 mg total) by mouth daily.   Calcium Carbonate-Vitamin D 600-400 MG-UNIT tablet Take 1 tablet by mouth daily.   cetirizine 10 MG tablet Commonly known as:  ZYRTEC Take 1 tablet by mouth daily.  cholecalciferol 25 MCG (1000 UT) tablet Commonly known as:  VITAMIN D Take 1,000 Units by mouth daily.   Coenzyme Q-10 200 MG Caps Take 1 capsule by mouth daily.   Dymista 137-50 MCG/ACT Susp Generic drug:  Azelastine-Fluticasone Place 1 spray into both nostrils 2 (two) times daily.   EpiPen 2-Pak 0.3 mg/0.3 mL Soaj injection Generic drug:  EPINEPHrine Inject 0.3 mg into the muscle as needed.   lactobacillus acidophilus Tabs tablet Take 2 tablets by mouth 3 (three) times daily.   levocetirizine 5 MG tablet Commonly known as:  XYZAL Take 1 tablet by mouth at bedtime.   MISC NATURAL PRODUCT OP Apply to eye.   NONFORMULARY OR COMPOUNDED ITEM Inject into the muscle once a week. Compounded  allergy shot.   pyridOXINE 25 MG tablet Commonly known as:  VITAMIN B-6 Take 1 tablet by mouth daily.   Red Yeast Rice Extract 600 MG Caps Take 1 capsule by mouth 2 (two) times daily.   tamsulosin 0.4 MG Caps capsule Commonly known as:  FLOMAX TAKE 1 CAPSULE (0.4 MG TOTAL) BY MOUTH ONCE DAILY TAKE 30 MINUTES AFTER SAME MEAL EACH DAY.   Vitamin B-12 CR 1000 MCG Tbcr Take 1 tablet by mouth daily.       Allergies:  Allergies  Allergen Reactions  . Augmentin [Amoxicillin-Pot Clavulanate] Nausea And Vomiting  . Prednisone Nausea And Vomiting  . Codeine     Other reaction(s): Unknown  . Erythromycin     Other reaction(s): Unknown  . Levofloxacin   . Omnicef [Cefdinir]     Family History: Family History  Problem Relation Age of Onset  . Breast cancer Maternal Grandmother        Late 96's    Social History:  reports that she has never smoked. She has never used smokeless tobacco. She reports current alcohol use of about 1.0 standard drinks of alcohol per week. She reports that she does not use drugs.  ROS: UROLOGY Frequent Urination?: Yes Hard to postpone urination?: No Burning/pain with urination?: No Get up at night to urinate?: No Leakage of urine?: No Urine stream starts and stops?: No Trouble starting stream?: No Do you have to strain to urinate?: No Blood in urine?: No Urinary tract infection?: No Sexually transmitted disease?: No Injury to kidneys or bladder?: No Painful intercourse?: No Weak stream?: No Currently pregnant?: No Vaginal bleeding?: No Last menstrual period?: n  Gastrointestinal Nausea?: No Vomiting?: No Indigestion/heartburn?: No Diarrhea?: No Constipation?: No  Constitutional Fever: No Night sweats?: No Weight loss?: No Fatigue?: No  Skin Skin rash/lesions?: No Itching?: No  Eyes Blurred vision?: No Double vision?: No  Ears/Nose/Throat Sore throat?: No Sinus problems?: No  Hematologic/Lymphatic Swollen glands?: No  Easy bruising?: No  Cardiovascular Leg swelling?: No Chest pain?: No  Respiratory Cough?: No Shortness of breath?: No  Endocrine Excessive thirst?: No  Musculoskeletal Back pain?: Yes Joint pain?: No  Neurological Headaches?: No Dizziness?: No  Psychologic Depression?: No Anxiety?: No  Physical Exam: BP (!) 162/85   Pulse (!) 120   Ht 5\' 3"  (1.6 m)   Wt 162 lb (73.5 kg)   BMI 28.70 kg/m   Constitutional:  Alert and oriented, No acute distress. HEENT: [place] AT, moist mucus membranes.  Trachea midline, no masses. Cardiovascular: No clubbing, cyanosis, or edema. Respiratory: Normal respiratory effort, no increased work of breathing. GI: Abdomen is soft, nontender, nondistended, no abdominal masses GU: No CVA tenderness Skin: No rashes, bruises or suspicious lesions. Neurologic: Grossly intact, no focal  deficits, moving all 4 extremities. Psychiatric: Normal mood and affect.  Laboratory Data: Lab Results  Component Value Date   WBC 11.2 (H) 02/03/2016   HGB 13.3 02/03/2016   HCT 39.6 02/03/2016   MCV 90.3 02/03/2016   PLT 249 02/03/2016    Lab Results  Component Value Date   CREATININE 0.69 02/03/2016    Urinalysis UA from PCPs office today reviewed, completely negative  Pertinent Imaging: Results for orders placed during the hospital encounter of 03/28/19  CT RENAL STONE STUDY   Narrative CLINICAL DATA:  Left flank pain 6 weeks.  Hematuria.  EXAM: CT ABDOMEN AND PELVIS WITHOUT CONTRAST  TECHNIQUE: Multidetector CT imaging of the abdomen and pelvis was performed following the standard protocol without IV contrast.  COMPARISON:  CT abdomen pelvis 01/31/2013  FINDINGS: Lower chest: Lung bases clear bilaterally.  Hepatobiliary: Multiple calcified gallstones. Negative for gallbladder wall thickening or biliary dilatation. No focal liver lesion.  Pancreas: Negative  Spleen: Negative  Adrenals/Urinary Tract: Left-sided hydronephrosis and  hydroureter. Obstructing stones distal left ureter. On coronal imaging there appear to be 2 adjacent stones in the ureter measuring approximately 6 mm, and 4 mm in diameter. It is possible this is all 1 stone. Additionally, there is been significant cortical atrophy since the prior study likely due to chronic infection.  Right kidney negative  Stomach/Bowel: Normal bowel gas pattern. No bowel obstruction or edema. Appendix not visualized.  Vascular/Lymphatic: Mild atherosclerotic disease in the aorta without aneurysm. No lymphadenopathy.  Reproductive: Calcified uterine fibroid on the left measuring approximately 2 cm. 2 cm right adnexal cyst slightly larger than on the prior study and appearing benign.  Other: No free fluid  Musculoskeletal: Lumbar dextroscoliosis and degenerative change. No acute skeletal abnormality.  IMPRESSION: Stones in the distal left ureter causing mild obstruction. 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  Normal right kidney  Cholelithiasis  These results will be called to the ordering clinician or representative by the Radiologist Assistant, and communication documented in the PACS or zVision Dashboard.   Electronically Signed   By: Franchot Gallo M.D.   On: 03/28/2019 12:59    CT scan was personally reviewed today and with the patient.  Agree with radiologic interpretation.  Hounsfield units approximately 800 HFU.  Assessment & Plan:    1. Left ureteral stone Adjacent 6 mm / 3 mm left distal ureteral calculi, obstructive in nature  Given the failure of the stones to pass in 30+ days as well as the composite size of the stone approximately 1 cm, I would recommend surgical intervention for treatment of the stones given her failure to pass it spontaneously.  We discussed various treatment options including ESWL versus ureteroscopy.  We discussed the risk and benefits of each as well as the efficacy rate.   Traditionally, a ESWL is not utilized for multiple stones but given the very close proximity, the stones essentially behave is 1 stone and thus it would be reasonable to consider this as an option.  She also has a fairly small habitus and relatively low stone density this this to be reasonable option.  We discussed that ESWL is less efficacious for distal stones compared to ureteroscopy and may require multiple procedures.  Her daughters had shockwave lithotripsy in the past and she is leaning towards this.  She understands that she may need further procedures in the future if this fails.  We discussed the additional risk including risk of bleeding, infection, damage surrounding structures, need for  alternative procedures amongst others.  All questions were answered.  She is willing to proceed as planned. - Urinalysis, Complete  2. Hydronephrosis of left kidney Secondary #1  3. Urinary frequency Likely secondary to location of the stone Will likely resolve with treatment No concern for infection is contributing factor   [name] Espy, MD  [facility] [address], [place], [place] [address] [phone]

## 2019-03-29 ENCOUNTER — Encounter: Payer: Self-pay | Admitting: *Deleted

## 2019-03-29 ENCOUNTER — Ambulatory Visit: Payer: Medicare Other

## 2019-03-29 ENCOUNTER — Other Ambulatory Visit: Payer: Self-pay

## 2019-03-29 ENCOUNTER — Encounter: Payer: Self-pay | Admitting: Anesthesiology

## 2019-03-29 ENCOUNTER — Ambulatory Visit
Admission: RE | Admit: 2019-03-29 | Discharge: 2019-03-29 | Disposition: A | Discharge status: Home / Self Care | Payer: Medicare Other | Attending: Urology | Admitting: Urology

## 2019-03-29 ENCOUNTER — Encounter
Admission: RE | Disposition: A | Discharge status: Home / Self Care | Payer: Self-pay | Source: Home / Self Care | Attending: Urology

## 2019-03-29 DIAGNOSIS — N201 Calculus of ureter: Secondary | ICD-10-CM | POA: Insufficient documentation

## 2019-03-29 DIAGNOSIS — Z1159 Encounter for screening for other viral diseases: Secondary | ICD-10-CM | POA: Diagnosis not present

## 2019-03-29 DIAGNOSIS — N200 Calculus of kidney: Secondary | ICD-10-CM

## 2019-03-29 HISTORY — PX: EXTRACORPOREAL SHOCK WAVE LITHOTRIPSY: SHX1557

## 2019-03-29 LAB — SARS CORONAVIRUS 2 BY RT PCR (HOSPITAL ORDER, PERFORMED IN ~~LOC~~ HOSPITAL LAB): SARS Coronavirus 2: NEGATIVE

## 2019-03-29 SURGERY — LITHOTRIPSY, ESWL
Anesthesia: Choice | Laterality: Left

## 2019-03-29 MED ORDER — SODIUM CHLORIDE 0.9 % IV SOLN: INTRAVENOUS | Status: DC

## 2019-03-29 MED ORDER — CIPROFLOXACIN HCL 500 MG PO TABS
500.0000 mg | ORAL_TABLET | ORAL | Status: AC
  Administered 2019-03-29: 500 mg via ORAL

## 2019-03-29 MED ORDER — CIPROFLOXACIN HCL 500 MG PO TABS
ORAL_TABLET | ORAL | Status: AC
  Filled 2019-03-29: qty 1

## 2019-03-29 MED ORDER — DIAZEPAM 5 MG PO TABS
10.0000 mg | ORAL_TABLET | ORAL | Status: AC
  Administered 2019-03-29: 10 mg via ORAL

## 2019-03-29 MED ORDER — ONDANSETRON HCL 4 MG/2ML IJ SOLN
4.0000 mg | Freq: Once | INTRAMUSCULAR | Status: AC
  Administered 2019-03-29: 4 mg via INTRAVENOUS

## 2019-03-29 MED ORDER — KETOROLAC TROMETHAMINE 10 MG PO TABS: 10.0000 mg | ORAL_TABLET | Freq: Four times a day (QID) | ORAL | 0 refills | Status: DC | PRN

## 2019-03-29 MED ORDER — DIPHENHYDRAMINE HCL 25 MG PO CAPS
25.0000 mg | ORAL_CAPSULE | ORAL | Status: AC
  Administered 2019-03-29: 25 mg via ORAL

## 2019-03-29 MED ORDER — DIPHENHYDRAMINE HCL 25 MG PO CAPS
ORAL_CAPSULE | ORAL | Status: AC
  Filled 2019-03-29: qty 1

## 2019-03-29 MED ORDER — ONDANSETRON HCL 4 MG/2ML IJ SOLN
INTRAMUSCULAR | Status: AC
  Filled 2019-03-29: qty 2

## 2019-03-29 MED ORDER — DIAZEPAM 5 MG PO TABS
ORAL_TABLET | ORAL | Status: AC
  Filled 2019-03-29: qty 2

## 2019-03-29 NOTE — OR Nursing (Signed)
Patient ambulated to the BR and voided 200 mls light red urine without clots and no sediments when strained. Well tolerated.

## 2019-03-29 NOTE — OR Nursing (Signed)
Covid results negative; pt to xray 0835 for KUB.

## 2019-03-29 NOTE — Discharge Instructions (Signed)
Follow [facility] stone lithotripsy discharge instruction sheet.

## 2019-04-03 ENCOUNTER — Telehealth: Payer: Self-pay

## 2019-04-03 ENCOUNTER — Other Ambulatory Visit: Payer: Self-pay | Admitting: Radiology

## 2019-04-03 ENCOUNTER — Telehealth: Payer: Self-pay | Admitting: Radiology

## 2019-04-03 DIAGNOSIS — N201 Calculus of ureter: Secondary | ICD-10-CM

## 2019-04-03 NOTE — Telephone Encounter (Signed)
error 

## 2019-04-03 NOTE — Telephone Encounter (Signed)
Patient left message on 04/02/2019 regarding "feeling bad" after extracorporeal shock wave lithotripsy performed 03/29/2019. Returned call. Patient states she feels much better today. Reported having no energy, pain at shockwave site, minimal hematuria, intermittent nausea over the weekend. Denies fever. Advised patient to use ibuprofen or naproxen and a heating pad for soreness and to call back if symptoms return. Questions answered. Patient expresses understanding of conversation.

## 2019-04-11 ENCOUNTER — Encounter: Payer: Self-pay | Admitting: Urology

## 2019-04-11 ENCOUNTER — Ambulatory Visit
Admission: RE | Admit: 2019-04-11 | Discharge: 2019-04-11 | Disposition: A | Discharge status: Home / Self Care | Payer: Medicare Other | Source: Ambulatory Visit | Attending: Urology | Admitting: Urology

## 2019-04-11 ENCOUNTER — Ambulatory Visit (INDEPENDENT_AMBULATORY_CARE_PROVIDER_SITE_OTHER): Payer: Medicare Other | Admitting: Urology

## 2019-04-11 ENCOUNTER — Other Ambulatory Visit: Payer: Self-pay

## 2019-04-11 VITALS — BP 161/84 | HR 89 | Ht 63.0 in | Wt 163.0 lb

## 2019-04-11 DIAGNOSIS — N201 Calculus of ureter: Secondary | ICD-10-CM | POA: Diagnosis not present

## 2019-04-11 DIAGNOSIS — R350 Frequency of micturition: Secondary | ICD-10-CM

## 2019-04-11 MED ORDER — TAMSULOSIN HCL 0.4 MG PO CAPS: ORAL_CAPSULE | ORAL | 0 refills | Status: DC

## 2019-04-11 NOTE — Progress Notes (Signed)
04/11/2019 2:51 PM   [name] [name] Mar 08, 1946 [id number]  Referring provider: [name], MD [place], [place] [address]  No chief complaint on file.   HPI: 73 year old female with 2 left distal ureteral calculi measuring 6 and 3 mm who returns today 2 weeks following ESWL with KUB.  She reports that following the procedure, she had some tenderness in her left lower quadrant for about 2 days and this resolved.  She continues to have urinary urgency and frequency.  She did pass a large amount of dust-like material.  She brought [place] a strain specimen today however this contained mostly toilet paper and hair.  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  She denies any fevers, chills, urgency, frequency, dysuria, or any other symptoms.  She is not having any pain.  She continues take Flomax.   PMH: Past Medical History:  Diagnosis Date  . Allergic genetic state   . Arthritis   . Cholelithiasis   . Depression   . Diarrhea   . Fibroids   . Hearing loss   . Hemorrhoids   . Hypothyroidism   . IBS (irritable bowel syndrome)   . Kidney stones   . Osteoarthrosis   . Plantar fasciitis   . Synovitis     Surgical History: Past Surgical History:  Procedure Laterality Date  . BUNIONECTOMY    . COLONOSCOPY    . COLONOSCOPY WITH PROPOFOL N/A 04/10/2018   Procedure: COLONOSCOPY WITH PROPOFOL;  Surgeon: [name], MD;  Location: [facility] ENDOSCOPY;  Service: Endoscopy;  Laterality: N/A;  . CYST EXCISION     mouth  . DILATION AND CURETTAGE OF UTERUS    . EXTRACORPOREAL SHOCK WAVE LITHOTRIPSY Left 03/29/2019   Procedure: EXTRACORPOREAL SHOCK WAVE LITHOTRIPSY (ESWL);  Surgeon: [name], MD;  Location: [facility] ORS;  Service: Urology;  Laterality: Left;  . HARDWARE REMOVAL    . JOINT REPLACEMENT    . TONSILLECTOMY    . WISDOM TOOTH EXTRACTION      Home  Medications:  Allergies as of 04/11/2019      Reactions   Augmentin [amoxicillin-pot Clavulanate] Nausea And Vomiting   Prednisone Nausea And Vomiting   Codeine    Other reaction(s): Unknown   Erythromycin    Other reaction(s): Unknown   Levofloxacin    Omnicef [cefdinir]       Medication List       Accurate as of April 11, 2019  2:51 PM. If you have any questions, ask your nurse or doctor.        STOP taking these medications   azithromycin 250 MG tablet Commonly known as:  ZITHROMAX Stopped by:  Hollice Espy, MD   Calcium Carbonate-Vitamin D 600-400 MG-UNIT tablet Stopped by:  Hollice Espy, MD   ketorolac 10 MG tablet Commonly known as:  TORADOL Stopped by:  Hollice Espy, MD   levocetirizine 5 MG tablet Commonly known as:  XYZAL Stopped by:  Hollice Espy, MD     TAKE these medications   aspirin 81 MG EC tablet Take 81 mg by mouth at bedtime.   cetirizine 10 MG tablet Commonly known as:  ZYRTEC Take 1 tablet by mouth daily.   cholecalciferol 25 MCG (1000 UT) tablet Commonly known as:  VITAMIN D Take 1,000 Units by mouth daily.   Coenzyme Q-10 200 MG Caps Take 1 capsule by mouth daily.   Dymista 137-50  MCG/ACT Susp Generic drug:  Azelastine-Fluticasone Place 1 spray into both nostrils 2 (two) times daily.   EpiPen 2-Pak 0.3 mg/0.3 mL Soaj injection Generic drug:  EPINEPHrine Inject 0.3 mg into the muscle as needed.   lactobacillus acidophilus Tabs tablet Take 2 tablets by mouth 3 (three) times daily.   MISC NATURAL PRODUCT OP Apply to eye.   NONFORMULARY OR COMPOUNDED ITEM Inject into the muscle once a week. Compounded allergy shot.   pyridOXINE 25 MG tablet Commonly known as:  VITAMIN B-6 Take 1 tablet by mouth daily.   Red Yeast Rice Extract 600 MG Caps Take 1 capsule by mouth 2 (two) times daily.   tamsulosin 0.4 MG Caps capsule Commonly known as:  FLOMAX TAKE 1 CAPSULE (0.4 MG TOTAL) BY MOUTH ONCE DAILY TAKE 30 MINUTES AFTER SAME  MEAL EACH DAY.   Vitamin B-12 CR 1000 MCG Tbcr Take 1 tablet by mouth daily.       Allergies:  Allergies  Allergen Reactions  . Augmentin [Amoxicillin-Pot Clavulanate] Nausea And Vomiting  . Prednisone Nausea And Vomiting  . Codeine     Other reaction(s): Unknown  . Erythromycin     Other reaction(s): Unknown  . Levofloxacin   . Omnicef [Cefdinir]     Family History: Family History  Problem Relation Age of Onset  . Breast cancer Maternal Grandmother        Late 37's    Social History:  reports that she has never smoked. She has never used smokeless tobacco. She reports current alcohol use of about 1.0 standard drinks of alcohol per week. She reports that she does not use drugs.  ROS: UROLOGY Frequent Urination?: Yes Hard to postpone urination?: No Burning/pain with urination?: No Get up at night to urinate?: No Leakage of urine?: No Urine stream starts and stops?: No Trouble starting stream?: No Do you have to strain to urinate?: No Blood in urine?: No Urinary tract infection?: No Sexually transmitted disease?: No Injury to kidneys or bladder?: No Painful intercourse?: No Weak stream?: No Currently pregnant?: No Vaginal bleeding?: No Last menstrual period?: n  Gastrointestinal Nausea?: No Vomiting?: No Indigestion/heartburn?: No Diarrhea?: No Constipation?: No  Constitutional Fever: No Night sweats?: No Weight loss?: No Fatigue?: No  Skin Skin rash/lesions?: No Itching?: No  Eyes Blurred vision?: No Double vision?: No  Ears/Nose/Throat Sore throat?: No Sinus problems?: No  Hematologic/Lymphatic Swollen glands?: No Easy bruising?: No  Cardiovascular Leg swelling?: No Chest pain?: No  Respiratory Cough?: No Shortness of breath?: No  Endocrine Excessive thirst?: No  Musculoskeletal Back pain?: No Joint pain?: No  Neurological Headaches?: No Dizziness?: No  Psychologic Depression?: No Anxiety?: No  Physical Exam: BP  (!) 161/84   Pulse 89   Ht 5\' 3"  (1.6 m)   Wt 163 lb (73.9 kg)   BMI 28.87 kg/m   Constitutional:  Alert and oriented, No acute distress. HEENT: [place] AT, moist mucus membranes.  Trachea midline, no masses. Cardiovascular: No clubbing, cyanosis, or edema. Respiratory: Normal respiratory effort, no increased work of breathing. Skin: No rashes, bruises or suspicious lesions. Neurologic: Grossly intact, no focal deficits, moving all 4 extremities. Psychiatric: Normal mood and affect.  Pertinent Imaging: KUB was personally reviewed today and compared to her previous KUB.  This shows excellent fragmentation of the left distal ureteral calculus however there is Steinstrasse of much smaller, numerable fragments that have migrated more distally.  Assessment & Plan:    1. Left ureteral stone Interval fragmentation of the stone via ESWL  with more distal migration of numerous fragments consistent with Steinstrasse  Given that she is not having any discomfort, will allow her an additional 2 weeks to continue to try to pass the stones spontaneously.  I encouraged hydration and continuation of Flomax which was refilled today.  We reviewed warning symptoms in detail including indications for more urgent, emergent evaluation  She is agreeable this plan will return in 2 weeks with KUB   - Abdomen 1 view (KUB); Future - tamsulosin (FLOMAX) 0.4 MG CAPS capsule; TAKE 1 CAPSULE (0.4 MG TOTAL) BY MOUTH ONCE DAILY TAKE 30 MINUTES AFTER SAME MEAL EACH DAY.  Dispense: 30 capsule; Refill: 0  2. Urinary frequency Likely secondary to #1 We will reassess when she is passed all fragments  Return in about 2 weeks (around 04/25/2019) for KUB.  Hollice Espy, MD  [facility] [address], [place], [place] [address] [phone]

## 2019-04-25 ENCOUNTER — Ambulatory Visit
Admission: RE | Admit: 2019-04-25 | Discharge: 2019-04-25 | Disposition: A | Discharge status: Home / Self Care | Payer: Medicare Other | Source: Ambulatory Visit | Attending: Urology | Admitting: Urology

## 2019-04-25 ENCOUNTER — Ambulatory Visit (INDEPENDENT_AMBULATORY_CARE_PROVIDER_SITE_OTHER): Payer: Medicare Other | Admitting: Urology

## 2019-04-25 ENCOUNTER — Other Ambulatory Visit: Payer: Self-pay

## 2019-04-25 ENCOUNTER — Encounter: Payer: Self-pay | Admitting: Urology

## 2019-04-25 VITALS — BP 169/80 | HR 96 | Ht 63.0 in | Wt 162.0 lb

## 2019-04-25 DIAGNOSIS — N200 Calculus of kidney: Secondary | ICD-10-CM

## 2019-04-25 DIAGNOSIS — N201 Calculus of ureter: Secondary | ICD-10-CM | POA: Insufficient documentation

## 2019-04-25 NOTE — Progress Notes (Signed)
04/25/2019 2:12 PM   [name] [name] 1945-11-13 [id number]  Referring provider: [name], MD [name] [name],  [name] [name] [address]  Chief Complaint  Patient presents with  . Nephrolithiasis    Follow up    HPI: 73 yo F s/p R ESWL who returns for follow up.  She initially presented with 2 adjacent obstructing left distal ureteral calculi measuring 6 and 3 mm.  She elected to undergo left ESWL on 04/29/2019.  She returned 2 weeks later at which time KUB showed excellent fragmentation of the stone however there was still residual small stone burden in her left distal ureter consistent with Steinstrasse.  She is now been 2 additional weeks to allow time for interval passage.  She reports that the weekend after her follow-up last time, she had some irritation with urination and some left lower quadrant pain which resolved by the end of the weekend.  Since then, she is had no flank pain, no urinary issues and no other concerns.  KUB today shows interval resolution of her left distal stone burden.  Personal history of kidney stones.  She is passed numerous spontaneously without difficulty.  She seen Dr. Bernardo [name] in the remote past but never required surgical intervention for stones.  She reports today that she has a hard time drinking lots of water due to increased urinary frequency.  She avoid salt.  She does enjoy lemon the Lyme's.  PMH: Past Medical History:  Diagnosis Date  . Allergic genetic state   . Arthritis   . Cholelithiasis   . Depression   . Diarrhea   . Fibroids   . Hearing loss   . Hemorrhoids   . Hypothyroidism   . IBS (irritable bowel syndrome)   . Kidney stones   . Osteoarthrosis   . Plantar fasciitis   . Synovitis     Surgical History: Past Surgical History:  Procedure Laterality Date  . BUNIONECTOMY    . COLONOSCOPY    . COLONOSCOPY WITH PROPOFOL N/A 04/10/2018   Procedure: COLONOSCOPY WITH PROPOFOL;  Surgeon: [name] Silvas, MD;   Location: [facility] ENDOSCOPY;  Service: Endoscopy;  Laterality: N/A;  . CYST EXCISION     mouth  . DILATION AND CURETTAGE OF UTERUS    . EXTRACORPOREAL SHOCK WAVE LITHOTRIPSY Left 03/29/2019   Procedure: EXTRACORPOREAL SHOCK WAVE LITHOTRIPSY (ESWL);  Surgeon: [name], MD;  Location: [facility] ORS;  Service: Urology;  Laterality: Left;  . HARDWARE REMOVAL    . JOINT REPLACEMENT    . TONSILLECTOMY    . WISDOM TOOTH EXTRACTION      Home Medications:  Allergies as of 04/25/2019      Reactions   Augmentin [amoxicillin-pot Clavulanate] Nausea And Vomiting   Prednisone Nausea And Vomiting   Codeine    Other reaction(s): Unknown   Erythromycin    Other reaction(s): Unknown   Levofloxacin    Omnicef [cefdinir]       Medication List       Accurate as of April 25, 2019  2:12 PM. If you have any questions, ask your nurse or doctor.        STOP taking these medications   tamsulosin 0.4 MG Caps capsule Commonly known as: FLOMAX Stopped by: [name] Espy, MD     TAKE these medications   aspirin 81 MG EC tablet Take 81 mg by mouth at bedtime.   cetirizine 10 MG tablet Commonly known as: ZYRTEC Take 1 tablet by mouth daily.  cholecalciferol 25 MCG (1000 UT) tablet Commonly known as: VITAMIN D Take 1,000 Units by mouth daily.   Coenzyme Q-10 200 MG Caps Take 1 capsule by mouth daily.   Dymista 137-50 MCG/ACT Susp Generic drug: Azelastine-Fluticasone Place 1 spray into both nostrils 2 (two) times daily.   EpiPen 2-Pak 0.3 mg/0.3 mL Soaj injection Generic drug: EPINEPHrine Inject 0.3 mg into the muscle as needed.   lactobacillus acidophilus Tabs tablet Take 2 tablets by mouth 3 (three) times daily.   MISC NATURAL PRODUCT OP Apply to eye.   NONFORMULARY OR COMPOUNDED ITEM Inject into the muscle once a week. Compounded allergy shot.   pyridOXINE 25 MG tablet Commonly known as: VITAMIN B-6 Take 1 tablet by mouth daily.   Red Yeast Rice Extract 600 MG Caps Take 1  capsule by mouth 2 (two) times daily.   Vitamin B-12 CR 1000 MCG Tbcr Take 1 tablet by mouth daily.       Allergies:  Allergies  Allergen Reactions  . Augmentin [Amoxicillin-Pot Clavulanate] Nausea And Vomiting  . Prednisone Nausea And Vomiting  . Codeine     Other reaction(s): Unknown  . Erythromycin     Other reaction(s): Unknown  . Levofloxacin   . Omnicef [Cefdinir]     Family History: Family History  Problem Relation Age of Onset  . Breast cancer Maternal Grandmother        Late 23's    Social History:  reports that she has never smoked. She has never used smokeless tobacco. She reports current alcohol use of about 1.0 standard drinks of alcohol per week. She reports that she does not use drugs.  ROS:                                        Physical Exam: BP (!) 169/80   Pulse 96   Ht 5\' 3"  (1.6 m)   Wt 162 lb (73.5 kg)   BMI 28.70 kg/m   Constitutional:  Alert and oriented, No acute distress. HEENT: [place] AT, moist mucus membranes.  Trachea midline, no masses. Cardiovascular: No clubbing, cyanosis, or edema. Respiratory: Normal respiratory effort, no increased work of breathing. Skin: No rashes, bruises or suspicious lesions. Neurologic: Grossly intact, no focal deficits, moving all 4 extremities. Psychiatric: Normal mood and affect.  Laboratory Data: Lab Results  Component Value Date   WBC 11.2 (H) 02/03/2016   HGB 13.3 02/03/2016   HCT 39.6 02/03/2016   MCV 90.3 02/03/2016   PLT 249 02/03/2016    Lab Results  Component Value Date   CREATININE 0.69 02/03/2016   Urinalysis    Component Value Date/Time   APPEARANCEUR Clear 03/28/2019 1553   GLUCOSEU Negative 03/28/2019 1553   BILIRUBINUR Negative 03/28/2019 1553   PROTEINUR Negative 03/28/2019 1553   NITRITE Negative 03/28/2019 1553   LEUKOCYTESUR Negative 03/28/2019 1553    Lab Results  Component Value Date   LABMICR See below: 03/28/2019   WBCUA 0-5 03/28/2019    LABEPIT 0-10 03/28/2019   BACTERIA None seen 03/28/2019    Pertinent Imaging: KUB from this morning was personally reviewed today.  She is had interval passage of her left distal ureteral fragments.  No distal ureteral calculi appreciated.  Assessment & Plan:    1. Left ureteral stone Status post successful ESWL for left distal ureteral calculi Interval passage of residual stone burden  2. Kidney stones We discussed general stone  prevention techniques including drinking plenty water with goal of producing 2.5 L urine daily, increased citric acid intake, avoidance of high oxalate containing foods, and decreased salt intake.  Information about dietary recommendations given today.   I would like her follow-up in 1 year with KUB or sooner as needed.  She is agreeable this plan. - Abdomen 1 view (KUB); Future   Return in about 1 year (around 04/24/2020).  [name] Espy, MD  [facility] [address] [place], [place] [address] [phone]

## 2019-10-18 ENCOUNTER — Other Ambulatory Visit: Payer: Self-pay | Admitting: Family Medicine

## 2019-10-18 ENCOUNTER — Other Ambulatory Visit: Payer: Self-pay | Admitting: Dental General Practice

## 2019-10-18 DIAGNOSIS — Z1231 Encounter for screening mammogram for malignant neoplasm of breast: Secondary | ICD-10-CM

## 2019-11-06 ENCOUNTER — Ambulatory Visit
Admission: EM | Admit: 2019-11-06 | Discharge: 2019-11-06 | Disposition: A | Discharge status: Home / Self Care | Payer: Medicare Other | Attending: Emergency Medicine | Admitting: Emergency Medicine

## 2019-11-06 DIAGNOSIS — N390 Urinary tract infection, site not specified: Secondary | ICD-10-CM | POA: Insufficient documentation

## 2019-11-06 DIAGNOSIS — R319 Hematuria, unspecified: Secondary | ICD-10-CM | POA: Diagnosis present

## 2019-11-06 LAB — POCT URINALYSIS DIP (MANUAL ENTRY)
Bilirubin, UA: NEGATIVE
Glucose, UA: NEGATIVE mg/dL
Ketones, POC UA: NEGATIVE mg/dL
Nitrite, UA: NEGATIVE
Protein Ur, POC: NEGATIVE mg/dL
Spec Grav, UA: 1.01 (ref 1.010–1.025)
Urobilinogen, UA: 0.2 E.U./dL
pH, UA: 6 (ref 5.0–8.0)

## 2019-11-06 MED ORDER — SULFAMETHOXAZOLE-TRIMETHOPRIM 800-160 MG PO TABS: 1.0000 | ORAL_TABLET | Freq: Two times a day (BID) | ORAL | 0 refills | Status: AC

## 2019-11-06 NOTE — Discharge Instructions (Addendum)
Take the antibiotic as directed.  Follow-up with your primary care provider tomorrow to let them know about your visit here today.  See your primary care provider in 1 week for a urine recheck.

## 2019-11-06 NOTE — ED Triage Notes (Signed)
Pt presents with 10 days of hematuria. Denies any other symptoms at this time. Pt reports that she did have some abnormality in her blood work for kidney function and has a follow up on that next week.

## 2019-11-06 NOTE — ED Provider Notes (Signed)
[name] [name]    CSN: [id number] Arrival date & time: 11/06/19  1734      History   Chief Complaint Chief Complaint  Patient presents with  . Hematuria    HPI [name] [name] is a 73 y.o. female.   Patient presents with hematuria x10 days.  She denies pain, fever, chills, dysuria, frequency, abdominal pain, back pain, or other symptoms.  Patient has a history of kidney stones and was recently diagnosed with CKD stage 3.  She has a follow-up appointment scheduled with her PCP next week for a recheck of her kidney function.    The history is provided by the patient.    Past Medical History:  Diagnosis Date  . Allergic genetic state   . Arthritis   . Cholelithiasis   . Depression   . Diarrhea   . Fibroids   . Hearing loss   . Hemorrhoids   . Hypothyroidism   . IBS (irritable bowel syndrome)   . Kidney stones   . Osteoarthrosis   . Plantar fasciitis   . Synovitis     Patient Active Problem List   Diagnosis Date Noted  . Bilateral hearing loss 03/28/2019  . Nephrolithiasis 03/28/2019  . Irritable bowel syndrome with diarrhea 01/13/2018  . Vitamin D deficiency 09/21/2017  . Age-related osteoporosis without current pathological fracture 04/02/2015  . Cholelithiasis without cholecystitis 04/02/2015    Past Surgical History:  Procedure Laterality Date  . BUNIONECTOMY    . COLONOSCOPY    . COLONOSCOPY WITH PROPOFOL N/A 04/10/2018   Procedure: COLONOSCOPY WITH PROPOFOL;  Surgeon: [name], MD;  Location: [facility] ENDOSCOPY;  Service: Endoscopy;  Laterality: N/A;  . CYST EXCISION     mouth  . DILATION AND CURETTAGE OF UTERUS    . EXTRACORPOREAL SHOCK WAVE LITHOTRIPSY Left 03/29/2019   Procedure: EXTRACORPOREAL SHOCK WAVE LITHOTRIPSY (ESWL);  Surgeon: [name], MD;  Location: [facility] ORS;  Service: Urology;  Laterality: Left;  . HARDWARE REMOVAL    . JOINT REPLACEMENT    . TONSILLECTOMY    . WISDOM TOOTH EXTRACTION      OB History   No obstetric  history on file.      Home Medications    Prior to Admission medications   Medication Sig Start Date End Date Taking? Authorizing Provider  aspirin 81 MG EC tablet Take 81 mg by mouth at bedtime.    Yes [provider]  Azelastine-Fluticasone (DYMISTA) 137-50 MCG/ACT SUSP Place 1 spray into both nostrils 2 (two) times daily. 03/19/15  Yes [provider]  cetirizine (ZYRTEC) 10 MG tablet Take 1 tablet by mouth daily.   Yes [provider]  cholecalciferol (VITAMIN D) 1000 units tablet Take 1,000 Units by mouth daily.   Yes [provider]  Coenzyme Q-10 200 MG CAPS Take 1 capsule by mouth daily.   Yes [provider]  Cyanocobalamin (VITAMIN B-12 CR) 1000 MCG TBCR Take 1 tablet by mouth daily.   Yes [provider]  EPIPEN 2-PAK 0.3 MG/0.3ML SOAJ injection Inject 0.3 mg into the muscle as needed. 12/10/15  Yes [provider]  lactobacillus acidophilus (BACID) TABS tablet Take 2 tablets by mouth 3 (three) times daily.   Yes [provider]  pyridOXINE (VITAMIN B-6) 25 MG tablet Take 1 tablet by mouth daily.   Yes [provider]  Red Yeast Rice Extract 600 MG CAPS Take 1 capsule by mouth 2 (two) times daily.   Yes [provider]  MISC NATURAL PRODUCT OP Apply to eye.    [provider]  NONFORMULARY OR COMPOUNDED ITEM Inject into the muscle once a week. Compounded allergy shot.    [provider]  sulfamethoxazole-trimethoprim (BACTRIM DS) 800-160 MG tablet Take 1 tablet by mouth 2 (two) times daily for 5 days. 11/06/19 11/11/19  Sharion Balloon, NP    Family History Family History  Problem Relation Age of Onset  . Breast cancer Maternal Grandmother        Late 80's  . Cancer Mother   . Kidney failure Father     Social History Social History   Tobacco Use  . Smoking status: Never Smoker  . Smokeless tobacco: Never Used  Substance Use Topics  . Alcohol use: Yes     Alcohol/week: 1.0 standard drinks    Types: 1 Glasses of wine per week    Comment: occas  . Drug use: Never     Allergies   Augmentin [amoxicillin-pot clavulanate], Prednisone, Codeine, Erythromycin, Levofloxacin, and Omnicef [cefdinir]   Review of Systems Review of Systems  Constitutional: Negative for chills and fever.  HENT: Negative for ear pain and sore throat.   Eyes: Negative for pain and visual disturbance.  Respiratory: Negative for cough and shortness of breath.   Cardiovascular: Negative for chest pain and palpitations.  Gastrointestinal: Negative for abdominal pain and vomiting.  Genitourinary: Positive for hematuria. Negative for dysuria, flank pain and frequency.  Musculoskeletal: Negative for arthralgias and back pain.  Skin: Negative for color change and rash.  Neurological: Negative for seizures and syncope.  All other systems reviewed and are negative.    Physical Exam Triage Vital Signs ED Triage Vitals  Enc Vitals Group     BP      Pulse      Resp      Temp      Temp src      SpO2      Weight      Height      Head Circumference      Peak Flow      Pain Score      Pain Loc      Pain Edu?      Excl. in [place]?    No data found.  Updated Vital Signs BP (!) 143/86   Pulse 80   Resp 18   SpO2 97%   Visual Acuity Right Eye Distance:   Left Eye Distance:   Bilateral Distance:    Right Eye Near:   Left Eye Near:    Bilateral Near:     Physical Exam Vitals and nursing note reviewed.  Constitutional:      General: She is not in acute distress.    Appearance: She is well-developed. She is not ill-appearing.  HENT:     Head: Normocephalic and atraumatic.     Mouth/Throat:     Mouth: Mucous membranes are moist.     Pharynx: Oropharynx is clear.  Eyes:     Conjunctiva/sclera: Conjunctivae normal.  Cardiovascular:     Rate and Rhythm: Normal rate and regular rhythm.     Heart sounds: No murmur.  Pulmonary:     Effort: Pulmonary effort is  normal. No respiratory distress.     Breath sounds: Normal breath sounds.  Abdominal:     General: Bowel sounds are normal. There is no distension.     Palpations: Abdomen is soft.     Tenderness: There is no abdominal tenderness. There is no  right CVA tenderness, left CVA tenderness, guarding or rebound.  Musculoskeletal:     Cervical back: Neck supple.  Skin:    General: Skin is warm and dry.     Findings: No rash.  Neurological:     General: No focal deficit present.     Mental Status: She is alert and oriented to person, place, and time.  Psychiatric:        Mood and Affect: Mood normal.        Behavior: Behavior normal.      UC Treatments / Results  Labs (all labs ordered are listed, but only abnormal results are displayed) Labs Reviewed  POCT URINALYSIS DIP (MANUAL ENTRY) - Abnormal; Notable for the following components:      Result Value   Blood, UA large (*)    Leukocytes, UA Trace (*)    All other components within normal limits  URINE CULTURE    EKG   Radiology No results found.  Procedures Procedures (including critical care time)  Medications Ordered in UC Medications - No data to display  Initial Impression / Assessment and Plan / UC Course  I have reviewed the triage vital signs and the nursing notes.  Pertinent labs & imaging results that were available during my care of the patient were reviewed by me and considered in my medical decision making (see chart for details).   Hematuria, UTI.  Treating with Septra DS.  Instructed patient to follow-up with her PCP tomorrow to inform them about today's visit here.  Instructed her to follow-up as scheduled with her PCP next week for a recheck of her kidney function and recheck of her urine.  Patient agrees to this plan of care.      Final Clinical Impressions(s) / UC Diagnoses   Final diagnoses:  Urinary tract infection with hematuria, site unspecified  Hematuria, unspecified type     Discharge  Instructions     Take the antibiotic as directed.  Follow-up with your primary care provider tomorrow to let them know about your visit here today.  See your primary care provider in 1 week for a urine recheck.        ED Prescriptions    Medication Sig Dispense Auth. Provider   sulfamethoxazole-trimethoprim (BACTRIM DS) 800-160 MG tablet Take 1 tablet by mouth 2 (two) times daily for 5 days. 10 tablet Sharion Balloon, NP     PDMP not reviewed this encounter.   Sharion Balloon, NP 11/06/19 [phone]

## 2019-11-08 LAB — URINE CULTURE: Culture: NO GROWTH

## 2019-12-03 ENCOUNTER — Ambulatory Visit
Admission: RE | Admit: 2019-12-03 | Discharge: 2019-12-03 | Disposition: A | Discharge status: Home / Self Care | Payer: Medicare Other | Source: Ambulatory Visit | Attending: Family Medicine | Admitting: Family Medicine

## 2019-12-03 DIAGNOSIS — Z1231 Encounter for screening mammogram for malignant neoplasm of breast: Secondary | ICD-10-CM | POA: Insufficient documentation

## 2019-12-04 ENCOUNTER — Other Ambulatory Visit: Payer: Self-pay | Admitting: Family Medicine

## 2019-12-04 DIAGNOSIS — N6320 Unspecified lump in the left breast, unspecified quadrant: Secondary | ICD-10-CM

## 2019-12-04 DIAGNOSIS — R928 Other abnormal and inconclusive findings on diagnostic imaging of breast: Secondary | ICD-10-CM

## 2019-12-07 ENCOUNTER — Ambulatory Visit
Admission: RE | Admit: 2019-12-07 | Discharge: 2019-12-07 | Disposition: A | Discharge status: Home / Self Care | Payer: Medicare Other | Source: Ambulatory Visit | Attending: Family Medicine | Admitting: Family Medicine

## 2019-12-07 DIAGNOSIS — N6320 Unspecified lump in the left breast, unspecified quadrant: Secondary | ICD-10-CM | POA: Diagnosis present

## 2019-12-07 DIAGNOSIS — R928 Other abnormal and inconclusive findings on diagnostic imaging of breast: Secondary | ICD-10-CM | POA: Insufficient documentation

## 2019-12-12 ENCOUNTER — Other Ambulatory Visit: Payer: Self-pay | Admitting: Family Medicine

## 2019-12-12 DIAGNOSIS — R928 Other abnormal and inconclusive findings on diagnostic imaging of breast: Secondary | ICD-10-CM

## 2019-12-12 DIAGNOSIS — N6320 Unspecified lump in the left breast, unspecified quadrant: Secondary | ICD-10-CM

## 2019-12-14 ENCOUNTER — Ambulatory Visit
Admission: RE | Admit: 2019-12-14 | Discharge: 2019-12-14 | Disposition: A | Discharge status: Home / Self Care | Payer: Medicare Other | Source: Ambulatory Visit | Attending: Family Medicine | Admitting: Family Medicine

## 2019-12-14 DIAGNOSIS — R928 Other abnormal and inconclusive findings on diagnostic imaging of breast: Secondary | ICD-10-CM | POA: Diagnosis present

## 2019-12-14 DIAGNOSIS — N6320 Unspecified lump in the left breast, unspecified quadrant: Secondary | ICD-10-CM | POA: Diagnosis present

## 2019-12-14 HISTORY — PX: BREAST CYST ASPIRATION: SHX578

## 2020-01-19 IMAGING — CR ABDOMEN - 1 VIEW
1 series · 2 of 2 positions shown · non-contrast
Comparison: 03/28/2019

CLINICAL DATA: Pre lithotripsy

EXAM:
ABDOMEN - 1 VIEW

[Series 1: dg abd 1 view · 0.14mm/px · 2 of 2 slices shown]
[im 1/2]
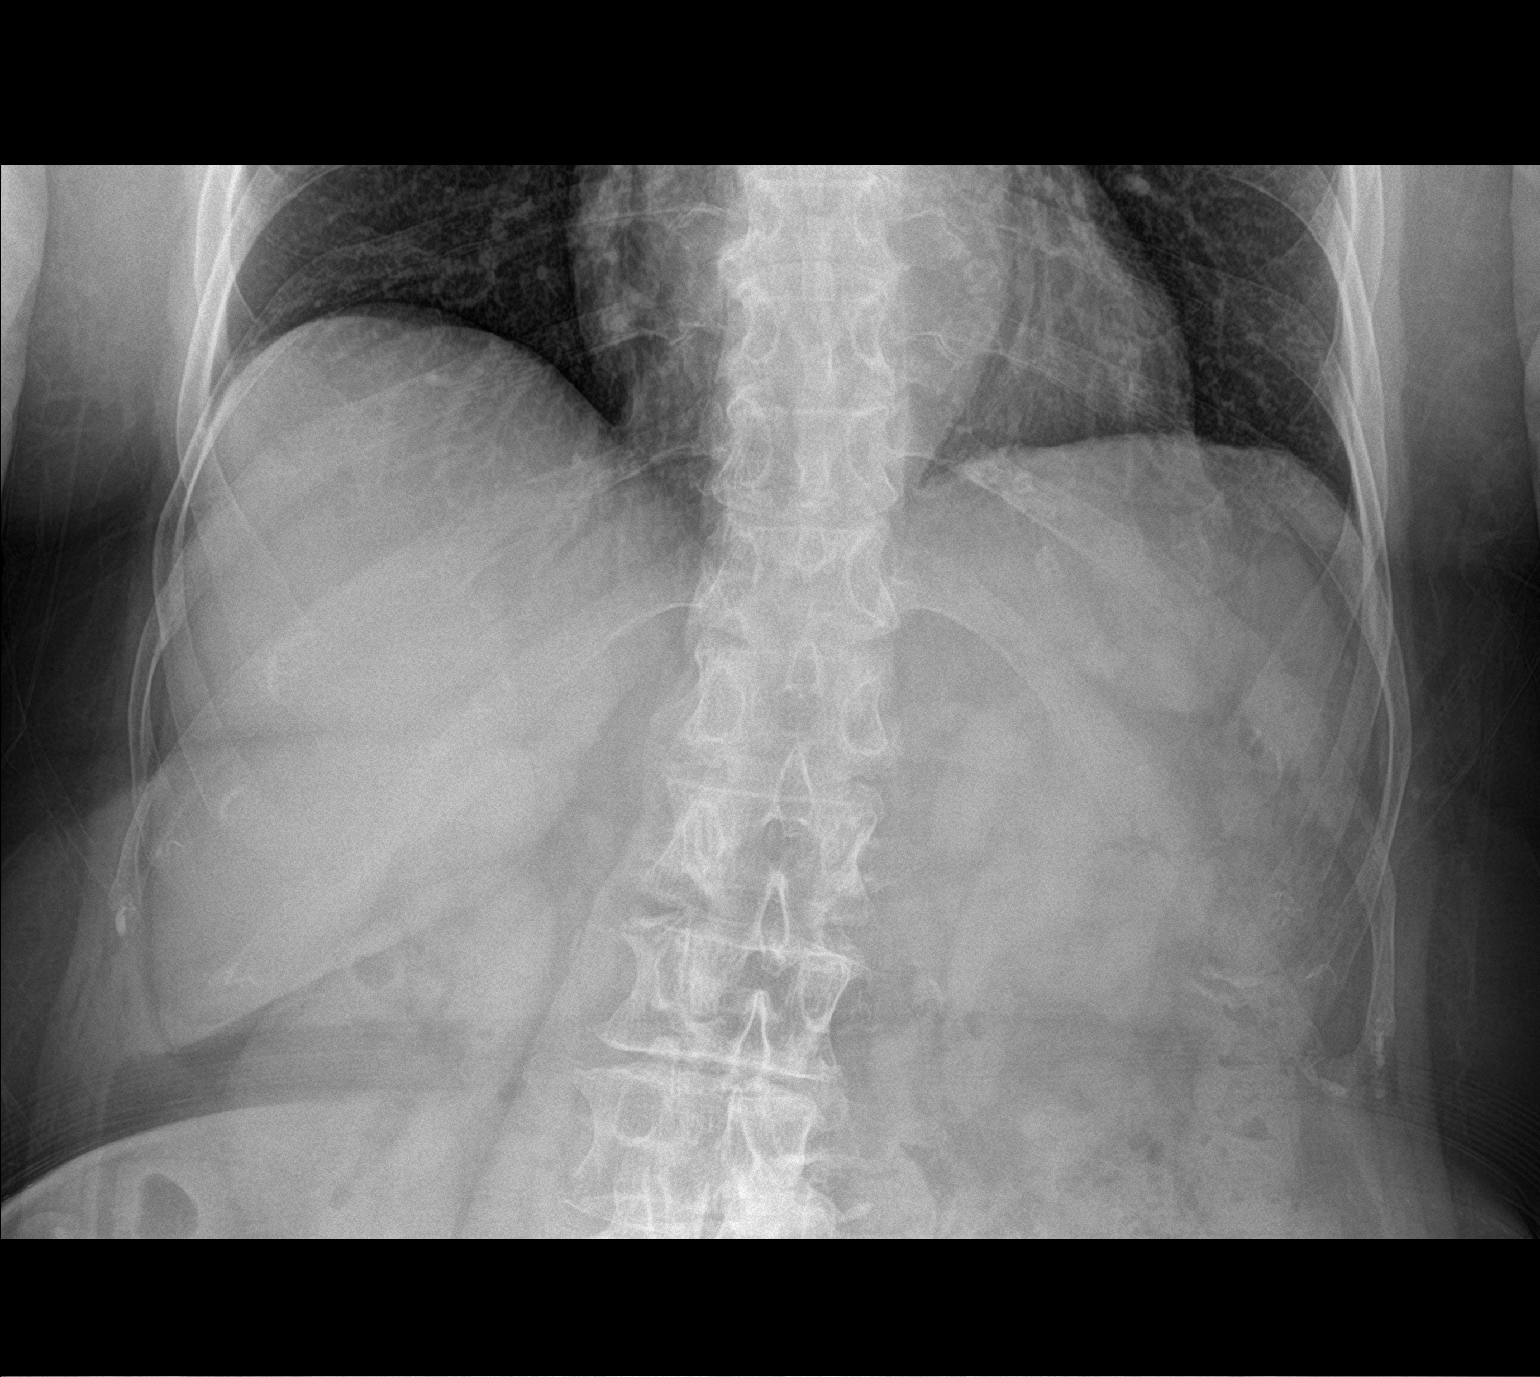
[im 2/2]
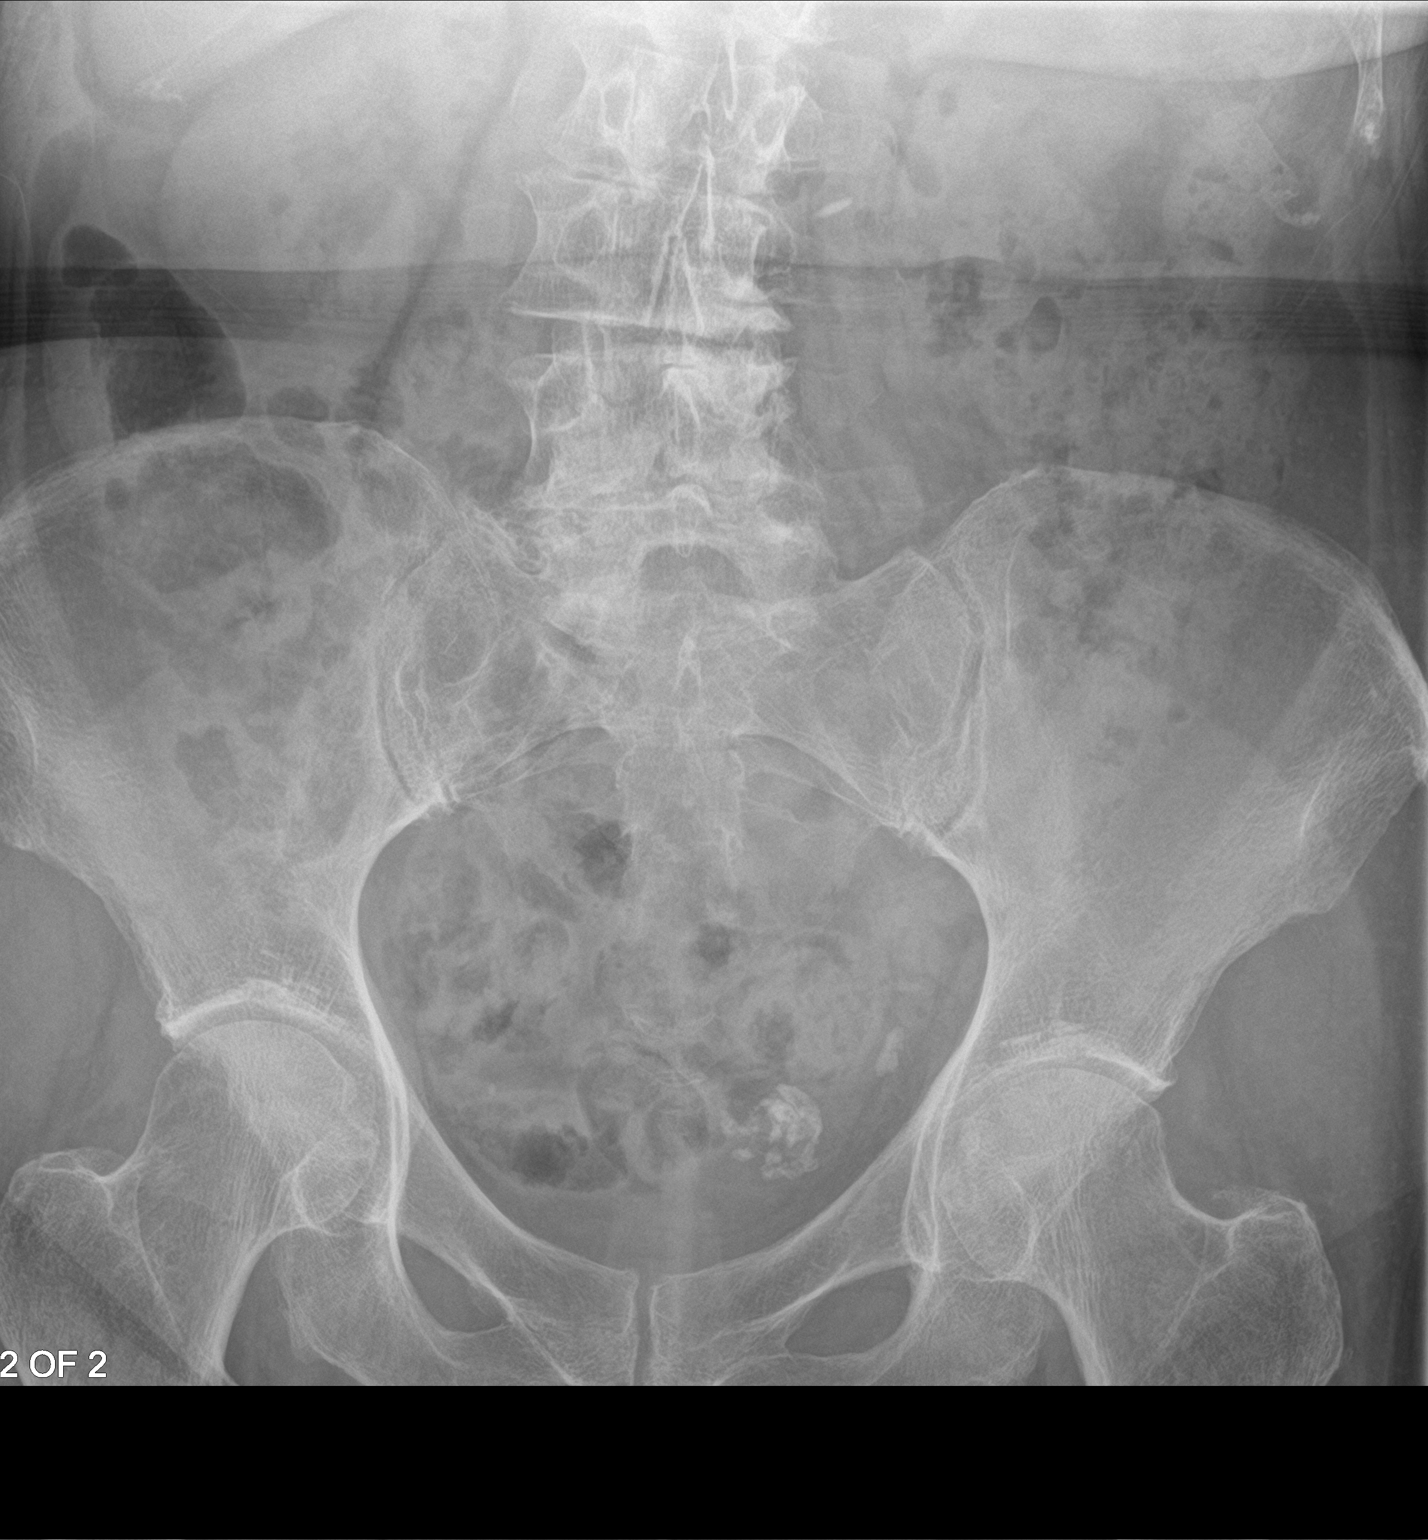

[2 of 2 positions shown; findings below may reference images not displayed]

FINDINGS: Scattered large and small bowel gas is noted. Calcified uterine
fibroid is noted in the left hemipelvis. Adjacent to this there is a
focal 12 mm calcification identified which corresponds to the distal
left ureteral stone seen on recent CT examination. No proximal renal
calculi are noted. Known gallstones are not well appreciated on this
exam. No acute bony abnormality is noted. Multilevel degenerative
changes of lumbar spine are seen.
IMPRESSION: Distal left ureteral stone.

## 2020-04-21 NOTE — Progress Notes (Signed)
04/22/20 1:08 PM   [name] [name] Oct 15, 1946 [id number]  Referring provider: [name], MD [place],  [place] [address] Chief Complaint  Patient presents with  . Follow-up    HPI: [name] [name] is a 74 y.o. F s/p R ESWL who returns for 1 year follow up.  Since last year, she denies any stone episodes or flank pain.   She was seen in the ED in 10/2019 w/ painless hematuria. She was treated w/ Bactrim for presumed UTI during this visit. Associated urine culture was negative.   F/u UA on PCP visit unremarkable for hematuria.   Personal history of kidney stones. She has passed numerous spontaneously without difficulty. She has seen Dr. Bernardo Heater in the remote past but never required surgical intervention for stones.  KUB today revealed punctate stone in left mid pole of kidney. No significant stone burden appreciated today.   Denies flank pain or bothersome urinary symptoms.   Never smoker.   PMH: Past Medical History:  Diagnosis Date  . Allergic genetic state   . Arthritis   . Cholelithiasis   . Depression   . Diarrhea   . Fibroids   . Hearing loss   . Hemorrhoids   . Hypothyroidism   . IBS (irritable bowel syndrome)   . Kidney stones   . Osteoarthrosis   . Plantar fasciitis   . Synovitis     Surgical History: Past Surgical History:  Procedure Laterality Date  . BUNIONECTOMY    . COLONOSCOPY    . COLONOSCOPY WITH PROPOFOL N/A 04/10/2018   Procedure: COLONOSCOPY WITH PROPOFOL;  Surgeon: [name], MD;  Location: [facility] ENDOSCOPY;  Service: Endoscopy;  Laterality: N/A;  . CYST EXCISION     mouth  . DILATION AND CURETTAGE OF UTERUS    . EXTRACORPOREAL SHOCK WAVE LITHOTRIPSY Left 03/29/2019   Procedure: EXTRACORPOREAL SHOCK WAVE LITHOTRIPSY (ESWL);  Surgeon: [name], MD;  Location: [facility] ORS;  Service: Urology;  Laterality: Left;  . HARDWARE REMOVAL    . JOINT REPLACEMENT    . TONSILLECTOMY    . WISDOM TOOTH EXTRACTION       Home Medications:  Allergies as of 04/22/2020      Reactions   Augmentin [amoxicillin-pot Clavulanate] Nausea And Vomiting   Prednisone Nausea And Vomiting   Codeine    Other reaction(s): Unknown   Erythromycin    Other reaction(s): Unknown   Levofloxacin    Omnicef [cefdinir]       Medication List       Accurate as of April 22, 2020 11:59 PM. If you have any questions, ask your nurse or doctor.        STOP taking these medications   lactobacillus acidophilus Tabs tablet Stopped by: Hollice Espy, MD   MISC NATURAL PRODUCT OP Stopped by: Hollice Espy, MD     TAKE these medications   aspirin 81 MG EC tablet Take 81 mg by mouth at bedtime.   cetirizine 10 MG tablet Commonly known as: ZYRTEC Take 1 tablet by mouth daily.   cholecalciferol 25 MCG (1000 UNIT) tablet Commonly known as: VITAMIN D Take 1,000 Units by mouth daily.   Coenzyme Q-10 200 MG Caps Take 1 capsule by mouth daily.   Dymista 137-50 MCG/ACT Susp Generic drug: Azelastine-Fluticasone Place 1 spray into both nostrils 2 (two) times daily.   EpiPen 2-Pak 0.3 mg/0.3 mL Soaj injection Generic drug: EPINEPHrine Inject 0.3 mg into the muscle as needed.   NONFORMULARY  OR COMPOUNDED ITEM Inject into the muscle once a week. Compounded allergy shot.   pyridOXINE 25 MG tablet Commonly known as: VITAMIN B-6 Take 1 tablet by mouth daily.   Red Yeast Rice Extract 600 MG Caps Take 1 capsule by mouth 2 (two) times daily.   Vitamin B-12 CR 1000 MCG Tbcr Take 1 tablet by mouth daily.       Allergies:  Allergies  Allergen Reactions  . Augmentin [Amoxicillin-Pot Clavulanate] Nausea And Vomiting  . Prednisone Nausea And Vomiting  . Codeine     Other reaction(s): Unknown  . Erythromycin     Other reaction(s): Unknown  . Levofloxacin   . Omnicef [Cefdinir]     Family History: Family History  Problem Relation Age of Onset  . Breast cancer Maternal Grandmother        Late 80's  . Cancer  Mother   . Kidney failure Father   . Breast cancer Sister 51    Social History:  reports that she has never smoked. She has never used smokeless tobacco. She reports current alcohol use of about 1.0 standard drink of alcohol per week. She reports that she does not use drugs.   Physical Exam: BP (!) 144/76   Pulse 80   Ht 5\' 4"  (1.626 m)   Wt 158 lb (71.7 kg)   BMI 27.12 kg/m   Constitutional:  Alert and oriented, No acute distress. HEENT: [place] AT, moist mucus membranes.  Trachea midline, no masses. Cardiovascular: No clubbing, cyanosis, or edema. Respiratory: Normal respiratory effort, no increased work of breathing. Skin: No rashes, bruises or suspicious lesions. Neurologic: Grossly intact, no focal deficits, moving all 4 extremities. Psychiatric: Normal mood and affect.  Pertinent Imaging: KUB reviewed personally however waiting on radiologist interpretation. Please see Epic.   Assessment & Plan:    1. Gross hematuria   Episode of painless gross hematuria approximately 6 months ago for which she has never had follow-up  Given her history of stones, is possible that she may have passed a stone although she was not having her typical flank pain  We discussed the differential diagnosis for gross hematuria  I have recommended pursuing cystoscopy at minimum and will consider additional upper tract imaging as needed.  I have explained to the patient that they will  be scheduled for a cystoscopy in our office to evaluate their bladder.  The cystoscopy consists of passing a tube with a lens up through their urethra and into their urinary bladder.   We will inject the urethra with a lidocaine gel prior to introducing the cystoscope to help with any discomfort during the procedure.   After the procedure, they might experience blood in the urine and discomfort with urination.  This will abate after the first few voids.  I have  encouraged the patient to increase water intake  during this  time.   Return for next available cysto   2. History of kidney stones S/p ESWL last year No signficant stone burden on KUB this year or obvious stone episodes  Return for cysto, 1year w/KUB.  [facility] [address] [place], [place] [address] [phone]  I, Lucas Mallow, am acting as a scribe for Dr. Hollice Espy,  I have reviewed the above documentation for accuracy and completeness, and I agree with the above.   Hollice Espy, MD   I spent 30 total minutes on the day of the encounter including pre-visit review of the medical record, face-to-face time  with the patient, and post visit ordering of labs/imaging/tests.

## 2020-04-22 ENCOUNTER — Ambulatory Visit: Payer: Medicare Other | Admitting: Urology

## 2020-04-22 ENCOUNTER — Other Ambulatory Visit: Payer: Self-pay

## 2020-04-22 ENCOUNTER — Ambulatory Visit
Admission: RE | Admit: 2020-04-22 | Discharge: 2020-04-22 | Disposition: A | Discharge status: Home / Self Care | Payer: Medicare Other | Attending: Urology | Admitting: Urology

## 2020-04-22 ENCOUNTER — Encounter: Payer: Self-pay | Admitting: Urology

## 2020-04-22 ENCOUNTER — Ambulatory Visit
Admission: RE | Admit: 2020-04-22 | Discharge: 2020-04-22 | Disposition: A | Discharge status: Home / Self Care | Payer: Medicare Other | Source: Ambulatory Visit | Attending: Urology | Admitting: Urology

## 2020-04-22 VITALS — BP 144/76 | HR 80 | Ht 64.0 in | Wt 158.0 lb

## 2020-04-22 DIAGNOSIS — N200 Calculus of kidney: Secondary | ICD-10-CM | POA: Insufficient documentation

## 2020-04-22 DIAGNOSIS — R310 Gross hematuria: Secondary | ICD-10-CM | POA: Diagnosis not present

## 2020-04-22 NOTE — Patient Instructions (Signed)
Cystoscopy Cystoscopy is a procedure that is used to help diagnose and sometimes treat conditions that affect the lower urinary tract. The lower urinary tract includes the bladder and the urethra. The urethra is the tube that drains urine from the bladder. Cystoscopy is done using a thin, tube-shaped instrument with a light and camera at the end (cystoscope). The cystoscope may be hard or flexible, depending on the goal of the procedure. The cystoscope is inserted through the urethra, into the bladder. Cystoscopy may be recommended if you have:  Urinary tract infections that keep coming back.  Blood in the urine (hematuria).  An inability to control when you urinate (urinary incontinence) or an overactive bladder.  Unusual cells found in a urine sample.  A blockage in the urethra, such as a urinary stone.  Painful urination.  An abnormality in the bladder found during an intravenous pyelogram (IVP) or CT scan. Cystoscopy may also be done to remove a sample of tissue to be examined under a microscope (biopsy). What are the risks? Generally, this is a safe procedure. However, problems may occur, including:  Infection.  Bleeding.  What happens during the procedure?  1. You will be given one or more of the following: ? A medicine to numb the area (local anesthetic). 2. The area around the opening of your urethra will be cleaned. 3. The cystoscope will be passed through your urethra into your bladder. 4. Germ-free (sterile) fluid will flow through the cystoscope to fill your bladder. The fluid will stretch your bladder so that your health care provider can clearly examine your bladder walls. 5. Your doctor will look at the urethra and bladder. 6. The cystoscope will be removed The procedure may vary among health care providers  What can I expect after the procedure? After the procedure, it is common to have: 1. Some soreness or pain in your abdomen and urethra. 2. Urinary symptoms.  These include: ? Mild pain or burning when you urinate. Pain should stop within a few minutes after you urinate. This may last for up to 1 week. ? A small amount of blood in your urine for several days. ? Feeling like you need to urinate but producing only a small amount of urine. Follow these instructions at home: General instructions  Return to your normal activities as told by your health care provider.   Do not drive for 24 hours if you were given a sedative during your procedure.  Watch for any blood in your urine. If the amount of blood in your urine increases, call your health care provider.  If a tissue sample was removed for testing (biopsy) during your procedure, it is up to you to get your test results. Ask your health care provider, or the department that is doing the test, when your results will be ready.  Drink enough fluid to keep your urine pale yellow.  Keep all follow-up visits as told by your health care provider. This is important. Contact a health care provider if you:  Have pain that gets worse or does not get better with medicine, especially pain when you urinate.  Have trouble urinating.  Have more blood in your urine. Get help right away if you:  Have blood clots in your urine.  Have abdominal pain.  Have a fever or chills.  Are unable to urinate. Summary  Cystoscopy is a procedure that is used to help diagnose and sometimes treat conditions that affect the lower urinary tract.  Cystoscopy is done using   a thin, tube-shaped instrument with a light and camera at the end.  After the procedure, it is common to have some soreness or pain in your abdomen and urethra.  Watch for any blood in your urine. If the amount of blood in your urine increases, call your health care provider.  If you were prescribed an antibiotic medicine, take it as told by your health care provider. Do not stop taking the antibiotic even if you start to feel better. This  information is not intended to replace advice given to you by your health care provider. Make sure you discuss any questions you have with your health care provider. Document Revised: 10/17/2018 Document Reviewed: 10/17/2018 Elsevier Patient Education  2020 [employer]   

## 2020-05-10 NOTE — Progress Notes (Signed)
° °  0706/2021  CC:  Chief Complaint  Patient presents with   Cysto    HPI: [name] [name] is a 74 y.o. female with an episode of painless gross hematuria 6 months ago who presents today for cystoscopy.  Please see previous notes for details.  Personal history of kidney stones. She has passed numerous spontaneously without difficulty. She has seen Dr. Bernardo Heater in the remote past but never required surgical intervention for stones.  She is status post ESWL last year.  KUB on 04/22/2020 revealed no definite radio-opaque calculi identified.  No further episodes of gross hematuria since last visit.  Patient has never smoked.    Blood pressure (!) 153/82, pulse 93, height 5\' 4"  (1.626 m), weight 153 lb (69.4 kg). NED. A&Ox3.   No respiratory distress   Abd soft, NT, ND Normal external genitalia with patent urethral meatus  Cystoscopy Procedure Note  Patient identification was confirmed, informed consent was obtained, and patient was prepped using Betadine solution.  Lidocaine jelly was administered per urethral meatus.    Procedure: - Flexible cystoscope introduced, without any difficulty.   - Thorough search of the bladder revealed:    normal urethral meatus    normal urothelium    no stones    no ulcers     no tumors    no urethral polyps    no trabeculation  - Ureteral orifices were normal in position and appearance.  Post-Procedure: - Patient tolerated the procedure well  Urinalysis  No microscopic hematuria.  Pertinent image  CLINICAL DATA:  Nephrolithiasis.  Lithotripsy 03/29/2019  EXAM: ABDOMEN - 1 VIEW  COMPARISON:  04/25/2019  FINDINGS: The bowel gas pattern is normal. No definite radio-opaque calculi or other significant radiographic abnormality are seen. Calcified uterine fibroid.  IMPRESSION: No definite radio-opaque calculi identified on today's exam.   Electronically Signed   By: Davina Poke D.O.   On: 04/23/2020 08:03   I  have personally reviewed the images and agree with radiologist interpretation.    Assessment/ Plan:  1. Gross hematuria Episode of painless gross hematuria approximately 6 months ago for which she has never had follow-up  Cystoscopy today is unremarkable, no evidence of disease in bladder  We discussed the AUA guidelines for gross hematuria evaluation which does in fact include upper tract imaging in the form of CT urogram for gross hematuria episodes.  Most recent cross-sectional imaging in the form of noncontrast CT scan 03/2019.   We discussed risk and benefits of this.  Given her history of stones and absence of smoking history, she would like to defer CT urogram.  She understands the possibility of a missed diagnosis such as a renal mass or upper tract malignancy.  She understands this risk is relatively small but not impossible.  We will hold off on any further upper tract imaging unless she has a recurrent episode.  2. History of kidney stones S/p ESWL last year KUB on 04/22/2020 revealed no definite radio-opaque calculi identified.  Follow up in 1 year with KUB    I, Selena Batten, am acting as a scribe for Dr. Hollice Espy.  I have reviewed the above documentation for accuracy and completeness, and I agree with the above.   Hollice Espy, MD

## 2020-05-13 ENCOUNTER — Encounter: Payer: Self-pay | Admitting: Urology

## 2020-05-13 ENCOUNTER — Other Ambulatory Visit: Payer: Self-pay

## 2020-05-13 ENCOUNTER — Ambulatory Visit: Payer: Medicare Other | Admitting: Urology

## 2020-05-13 VITALS — BP 153/82 | HR 93 | Ht 64.0 in | Wt 153.0 lb

## 2020-05-13 DIAGNOSIS — R310 Gross hematuria: Secondary | ICD-10-CM

## 2020-05-14 ENCOUNTER — Encounter: Payer: Self-pay | Admitting: Urology

## 2020-05-14 LAB — URINALYSIS, COMPLETE
Bilirubin, UA: NEGATIVE
Glucose, UA: NEGATIVE
Ketones, UA: NEGATIVE
Nitrite, UA: NEGATIVE
Protein,UA: NEGATIVE
RBC, UA: NEGATIVE
Specific Gravity, UA: 1.015 (ref 1.005–1.030)
Urobilinogen, Ur: 0.2 mg/dL (ref 0.2–1.0)
pH, UA: 5.5 (ref 5.0–7.5)

## 2020-05-14 LAB — MICROSCOPIC EXAMINATION

## 2020-08-28 ENCOUNTER — Ambulatory Visit
Admission: EM | Admit: 2020-08-28 | Discharge: 2020-08-28 | Disposition: A | Discharge status: Home / Self Care | Payer: Medicare Other | Attending: Family Medicine | Admitting: Family Medicine

## 2020-08-28 DIAGNOSIS — L03211 Cellulitis of face: Secondary | ICD-10-CM | POA: Diagnosis not present

## 2020-08-28 DIAGNOSIS — R519 Headache, unspecified: Secondary | ICD-10-CM | POA: Diagnosis not present

## 2020-08-28 MED ORDER — DOXYCYCLINE HYCLATE 100 MG PO CAPS: 100.0000 mg | ORAL_CAPSULE | Freq: Two times a day (BID) | ORAL | 0 refills | Status: DC

## 2020-08-28 MED ORDER — MUPIROCIN 2 % EX OINT: 1.0000 "application " | TOPICAL_OINTMENT | Freq: Two times a day (BID) | CUTANEOUS | 0 refills | Status: DC

## 2020-08-28 NOTE — Discharge Instructions (Addendum)
I have sent in doxycycline for you to take twice a day for 7 days. Eat with this medication, as it can upset your stomach.  I have also sent in mupirocin ointment for you to use topically twice a day.  Follow up with this office or with primary care if symptoms are persisting.  Follow up in the ER for high fever, trouble swallowing, trouble breathing, other concerning symptoms.

## 2020-08-28 NOTE — ED Triage Notes (Addendum)
Patient presents to Urgent Care with complaints of spot left lower chin that she noted sat. it has spread, has some swelling and redness to location.

## 2020-08-28 NOTE — ED Provider Notes (Signed)
[place]   [id number] 08/28/20 Arrival Time: 1011  CC: RASH  SUBJECTIVE:  [name] [name] is a 74 y.o. female who presents with a skin complaint that began about 4 days ago. Reports that she has a place to her left chin that is red, warm, tender to touch and growing. Reports that she has had this in the past in about 2018. Denies precipitating event or trauma. Denies changes in soaps, detergents, close contacts with similar rash, known trigger or environmental trigger, allergy. Denies medications change or starting a new medication recently. Has been using old mupirocin ointment with no relief. There are no aggravating or alleviating factors. Denies fever, chills, nausea, vomiting, discharge, oral lesions, SOB, chest pain, abdominal pain, changes in bowel or bladder function.    ROS: As per HPI.  All other pertinent ROS negative.     Past Medical History:  Diagnosis Date  . Allergic genetic state   . Arthritis   . Cholelithiasis   . Depression   . Diarrhea   . Fibroids   . Hearing loss   . Hemorrhoids   . Hypothyroidism   . IBS (irritable bowel syndrome)   . Kidney stones   . Osteoarthrosis   . Plantar fasciitis   . Synovitis    Past Surgical History:  Procedure Laterality Date  . BUNIONECTOMY    . COLONOSCOPY    . COLONOSCOPY WITH PROPOFOL N/A 04/10/2018   Procedure: COLONOSCOPY WITH PROPOFOL;  Surgeon: [name], MD;  Location: [facility] ENDOSCOPY;  Service: Endoscopy;  Laterality: N/A;  . CYST EXCISION     mouth  . DILATION AND CURETTAGE OF UTERUS    . EXTRACORPOREAL SHOCK WAVE LITHOTRIPSY Left 03/29/2019   Procedure: EXTRACORPOREAL SHOCK WAVE LITHOTRIPSY (ESWL);  Surgeon: [name], MD;  Location: [facility] ORS;  Service: Urology;  Laterality: Left;  . HARDWARE REMOVAL    . JOINT REPLACEMENT    . TONSILLECTOMY    . WISDOM TOOTH EXTRACTION     Allergies  Allergen Reactions  . Augmentin [Amoxicillin-Pot Clavulanate] Nausea And Vomiting  . Prednisone  Nausea And Vomiting  . Codeine     Other reaction(s): Unknown  . Erythromycin     Other reaction(s): Unknown  . Levofloxacin   . Omnicef [Cefdinir]    No current facility-administered medications on file prior to encounter.   Current Outpatient Medications on File Prior to Encounter  Medication Sig Dispense Refill  . aspirin 81 MG EC tablet Take 81 mg by mouth at bedtime.     . Azelastine-Fluticasone (DYMISTA) 137-50 MCG/ACT SUSP Place 1 spray into both nostrils 2 (two) times daily.    . cetirizine (ZYRTEC) 10 MG tablet Take 1 tablet by mouth daily.    . cholecalciferol (VITAMIN D) 1000 units tablet Take 1,000 Units by mouth daily.    . Coenzyme Q-10 200 MG CAPS Take 1 capsule by mouth daily.    . Cyanocobalamin (VITAMIN B-12 CR) 1000 MCG TBCR Take 1 tablet by mouth daily.    Marland Kitchen EPIPEN 2-PAK 0.3 MG/0.3ML SOAJ injection Inject 0.3 mg into the muscle as needed.  3  . NONFORMULARY OR COMPOUNDED ITEM Inject into the muscle once a week. Compounded allergy shot.    . pyridOXINE (VITAMIN B-6) 25 MG tablet Take 1 tablet by mouth daily.    . Red Yeast Rice Extract 600 MG CAPS Take 1 capsule by mouth 2 (two) times daily.    . tamsulosin (FLOMAX) 0.4 MG CAPS capsule Take by mouth.    Marland Kitchen  Zinc 100 MG TABS      Social History   Socioeconomic History  . Marital status: Widowed    Spouse name: Not on file  . Number of children: Not on file  . Years of education: Not on file  . Highest education level: Not on file  Occupational History  . Not on file  Tobacco Use  . Smoking status: Never Smoker  . Smokeless tobacco: Never Used  Vaping Use  . Vaping Use: Never used  Substance and Sexual Activity  . Alcohol use: Yes    Alcohol/week: 1.0 standard drink    Types: 1 Glasses of wine per week    Comment: occas  . Drug use: Never  . Sexual activity: Yes  Other Topics Concern  . Not on file  Social History Narrative  . Not on file   Social Determinants of Health   Financial Resource Strain:    . Difficulty of Paying Living Expenses: Not on file  Food Insecurity:   . Worried About Charity fundraiser in the Last Year: Not on file  . Ran Out of Food in the Last Year: Not on file  Transportation Needs:   . Lack of Transportation (Medical): Not on file  . Lack of Transportation (Non-Medical): Not on file  Physical Activity:   . Days of Exercise per Week: Not on file  . Minutes of Exercise per Session: Not on file  Stress:   . Feeling of Stress : Not on file  Social Connections:   . Frequency of Communication with Friends and Family: Not on file  . Frequency of Social Gatherings with Friends and Family: Not on file  . Attends Religious Services: Not on file  . Active Member of Clubs or Organizations: Not on file  . Attends Archivist Meetings: Not on file  . Marital Status: Not on file  Intimate Partner Violence:   . Fear of Current or Ex-Partner: Not on file  . Emotionally Abused: Not on file  . Physically Abused: Not on file  . Sexually Abused: Not on file   Family History  Problem Relation Age of Onset  . Breast cancer Maternal Grandmother        Late 80's  . Cancer Mother   . Kidney failure Father   . Breast cancer Sister 33    OBJECTIVE: Vitals:   08/28/20 1049  BP: 133/84  Pulse: 73  Resp: 16  SpO2: 97%    General appearance: alert; no distress Head: NCAT Lungs: clear to auscultation bilaterally Heart: regular rate and rhythm.  Radial pulse 2+ bilaterally Extremities: no edema Skin: warm and dry;about 1 cm in diameter erythematous, warm, tender to touch lesion to L chin Psychological: alert and cooperative; normal mood and affect  ASSESSMENT & PLAN:  1. Cellulitis of face   2. Facial pain     Meds ordered this encounter  Medications  . mupirocin ointment (BACTROBAN) 2 %    Sig: Apply 1 application topically 2 (two) times daily.    Dispense:  15 g    Refill:  0    Order Specific Question:   Supervising Provider    Answer:    Chase Picket [id number]  . doxycycline (VIBRAMYCIN) 100 MG capsule    Sig: Take 1 capsule (100 mg total) by mouth 2 (two) times daily.    Dispense:  14 capsule    Refill:  0    Order Specific Question:   Supervising Provider  AnswerChase Picket [[id number]]    Will treat as cellulitis Prescribed mupirocin Prescribed doxycycline  Take as prescribed and to completion Avoid hot showers/ baths Moisturize skin daily  Follow up with PCP if symptoms persists Return or go to the ER if you have any new or worsening symptoms such as fever, chills, nausea, vomiting, redness, swelling, discharge, if symptoms do not improve with medications  Reviewed expectations re: course of current medical issues. Questions answered. Outlined signs and symptoms indicating need for more acute intervention. Patient verbalized understanding. After Visit Summary given.   Faustino Congress, NP 08/28/20 1109

## 2020-09-24 IMAGING — MG DIGITAL SCREENING BILAT W/ TOMO W/ CAD
6 of 10 series · 6 of 30 positions shown · non-contrast
Comparison: Previous exam(s).

CLINICAL DATA: Screening.

EXAM:
DIGITAL SCREENING BILATERAL MAMMOGRAM WITH TOMO AND CAD

[R CC synth-2D]
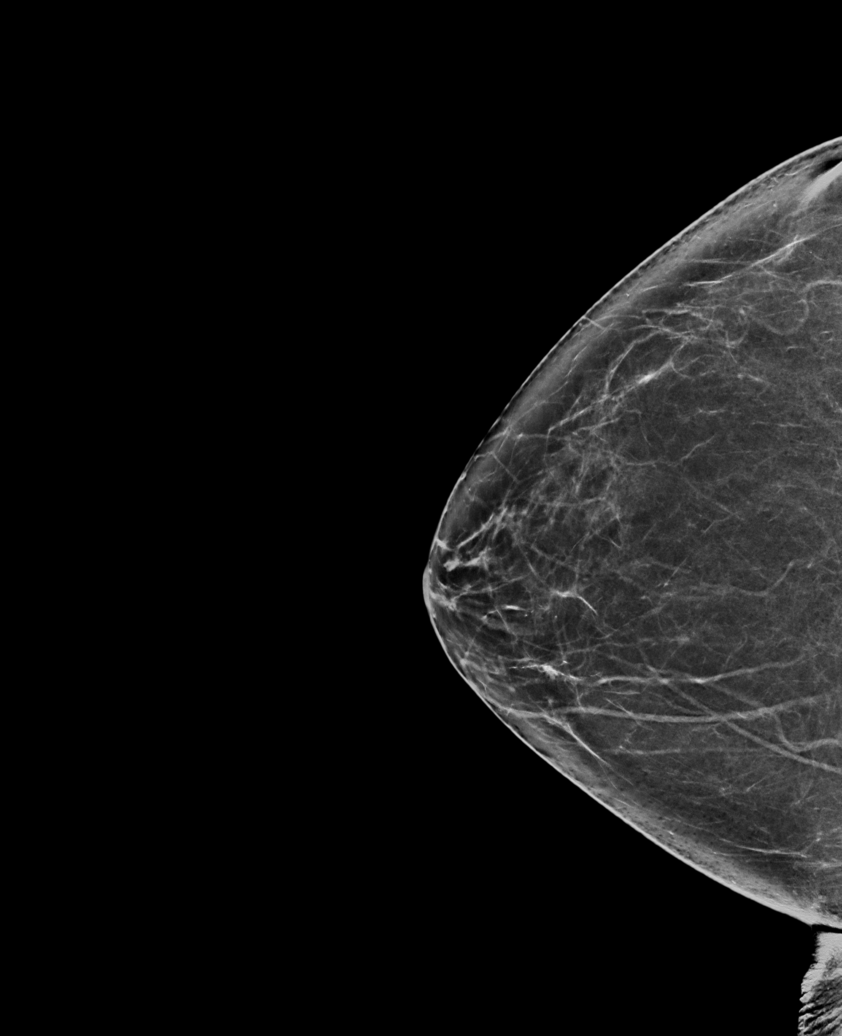

[L CC synth-2D]
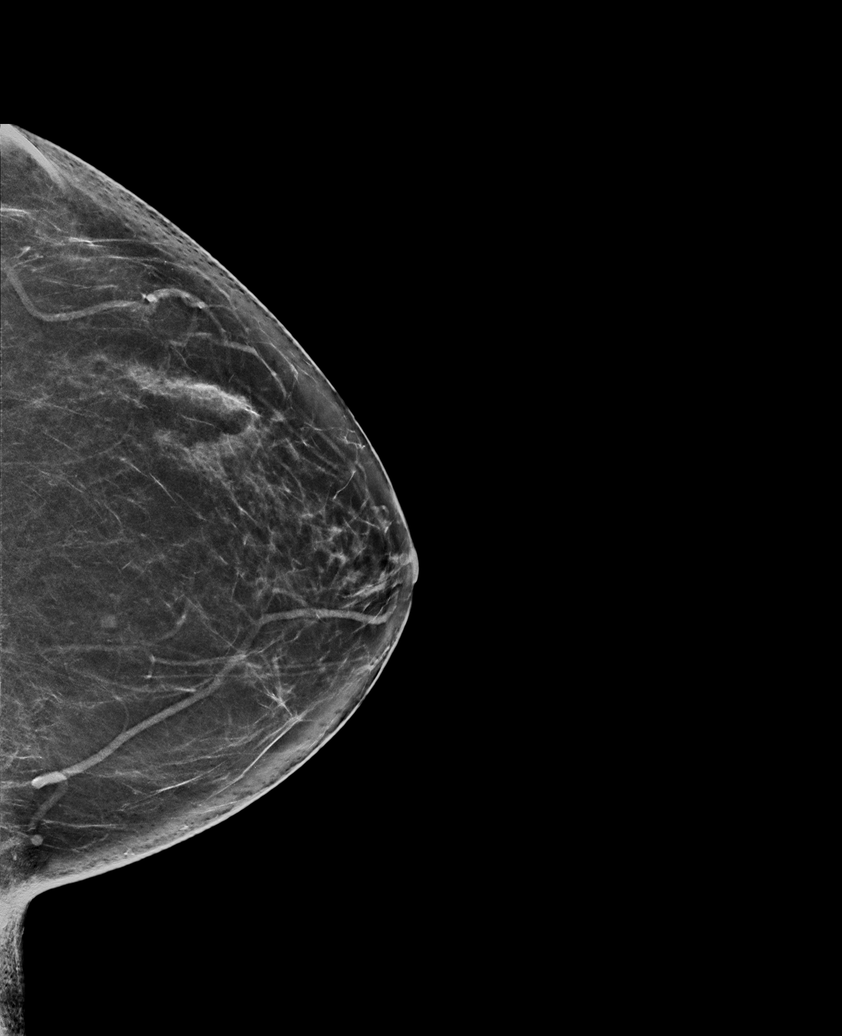

[R MLO synth-2D]
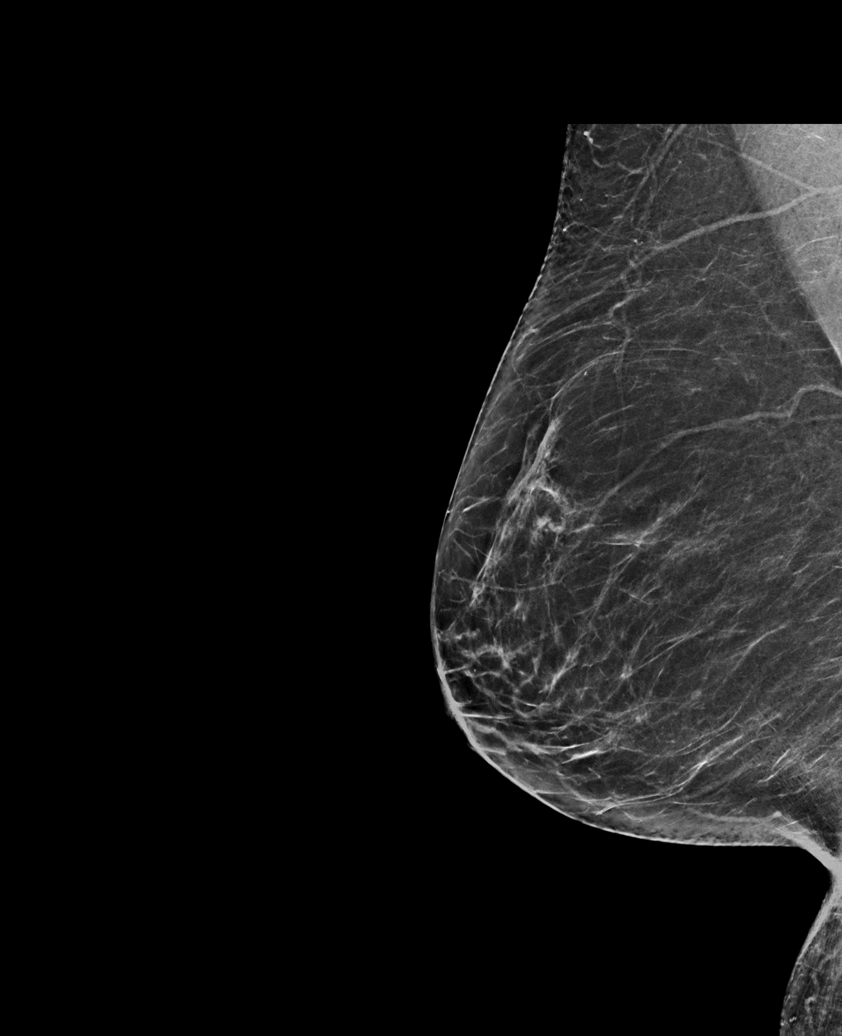

[R AT synth-2D]
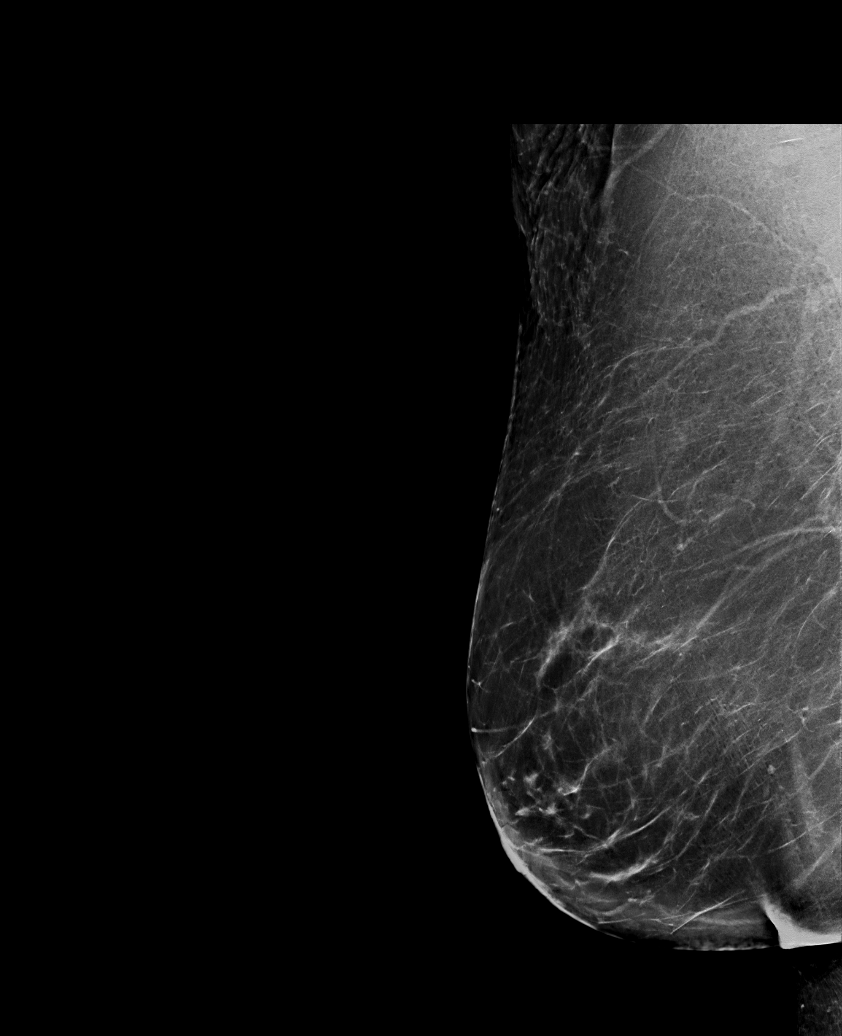

[L MLO synth-2D]
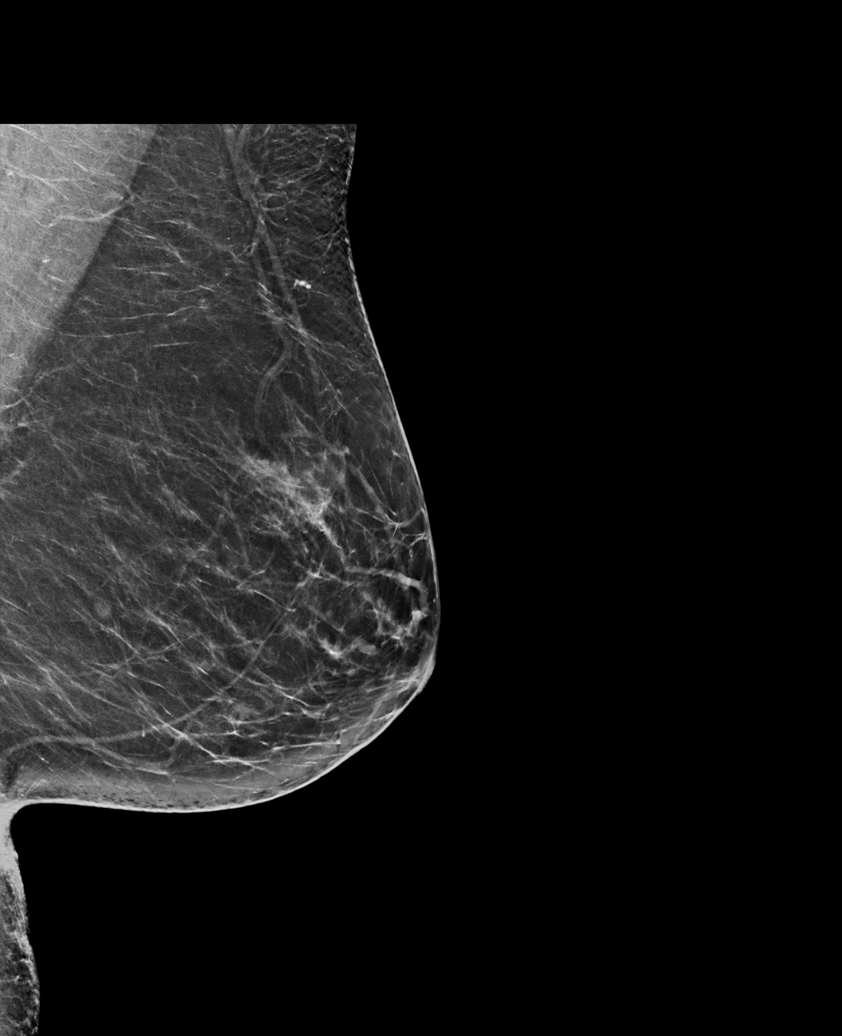

[R MLO tomo · tomo slice 35/70.0]
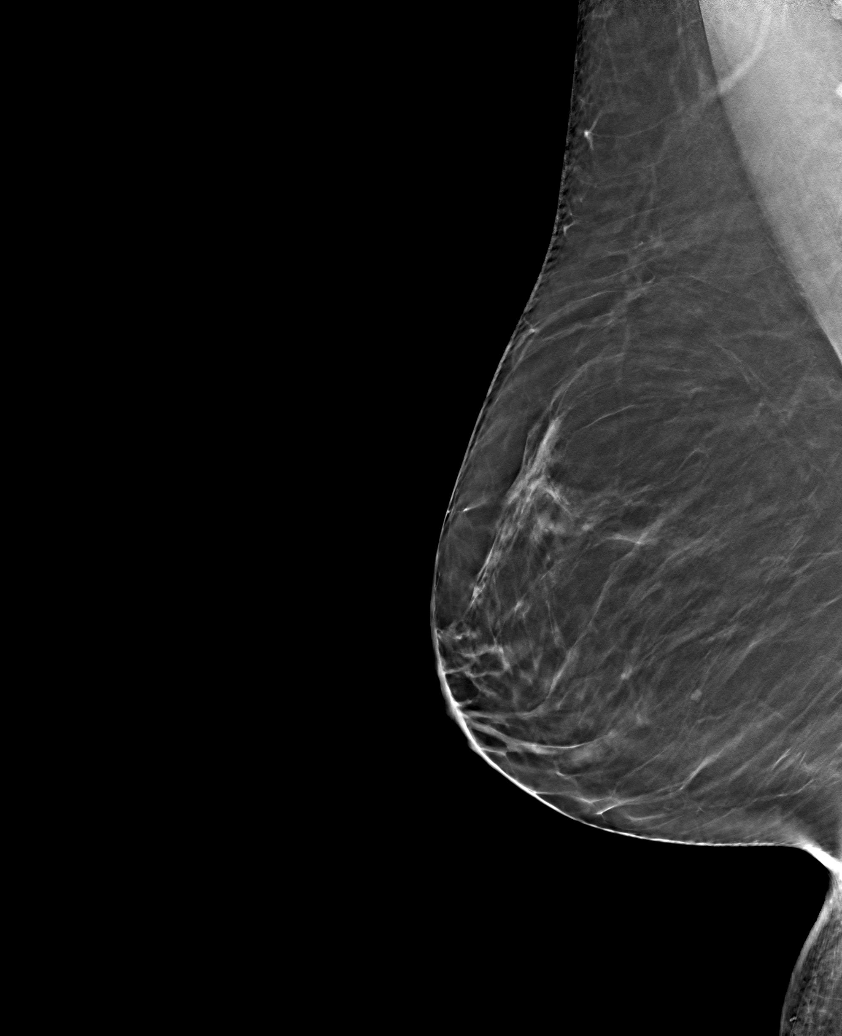

[6 of 30 positions shown; findings below may reference images not displayed]

ACR Breast Density Category b: There are scattered areas of
fibroglandular density.
FINDINGS: In the left breast, a possible mass warrants further evaluation. In
the right breast, no findings suspicious for malignancy. Images were
processed with CAD.
IMPRESSION: Further evaluation is suggested for possible mass in the left
breast.

RECOMMENDATION:
Ultrasound of the left breast. (Code:F4-8-550)

The patient will be contacted regarding the findings, and additional
imaging will be scheduled.

BI-RADS CATEGORY  0: Incomplete. Need additional imaging evaluation
and/or prior mammograms for comparison.

## 2020-10-05 IMAGING — MG US BREAST CYST ASPIRATION 1ST CYST
1 series · 8 of 8 positions shown · non-contrast
Comparison: Previous exams.

CLINICAL DATA: 6 mm hypoechoic mass in the 8 o'clock position of
the left breast at recent mammography and ultrasound.

EXAM:
ULTRASOUND GUIDED LEFT BREAST CYST ASPIRATION

[Series 1: MG view · 0.06mm/px · 8 of 16 slices shown]
[im 1/16]
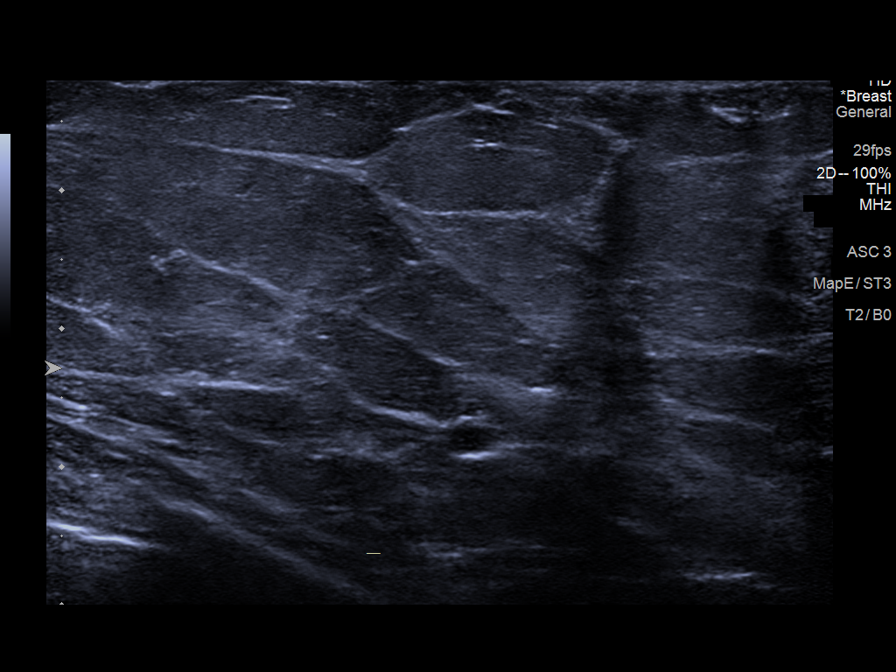
[im 3/16]
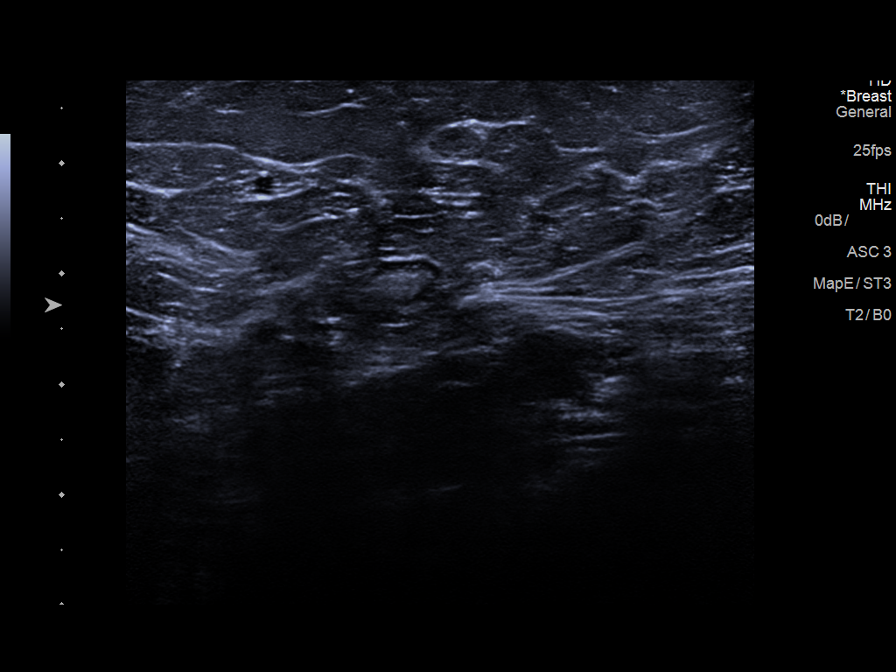
[im 5/16]
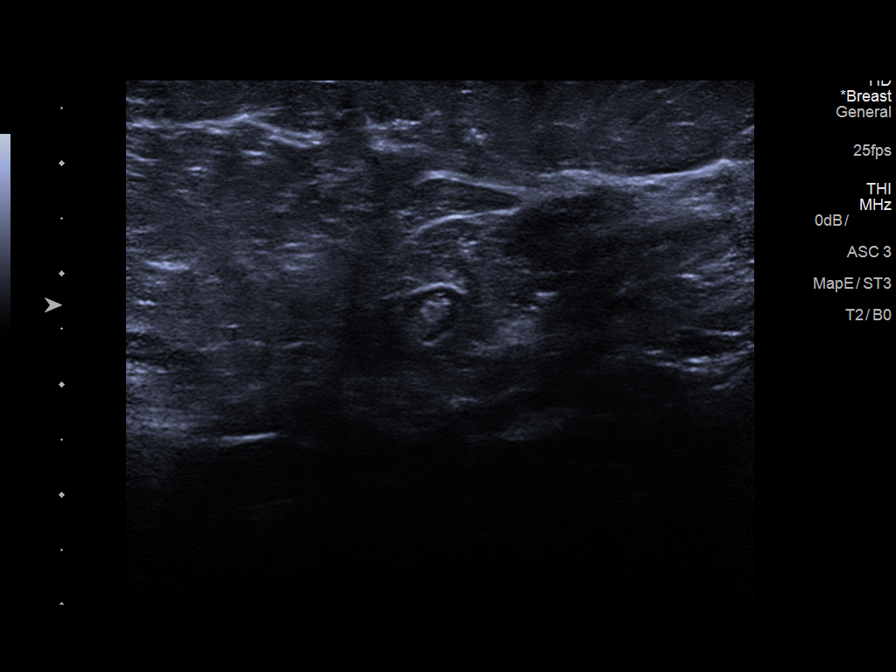
[im 7/16]
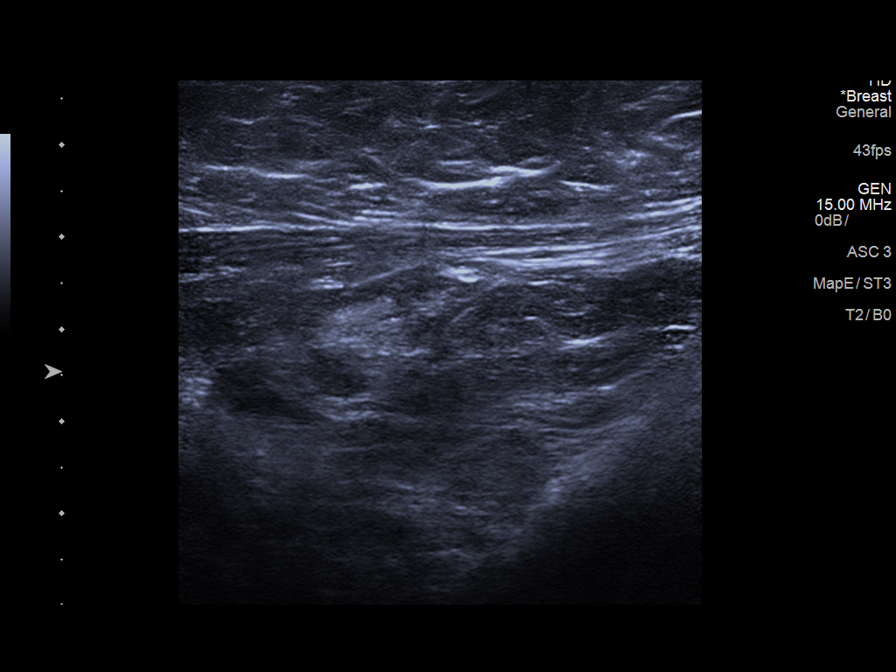
[im 9/16]
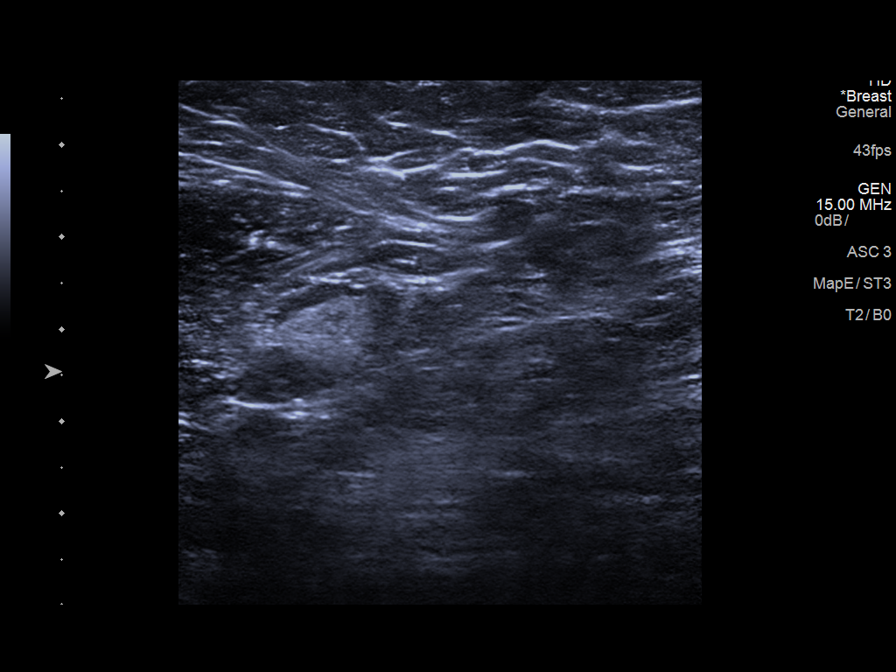
[im 11/16]
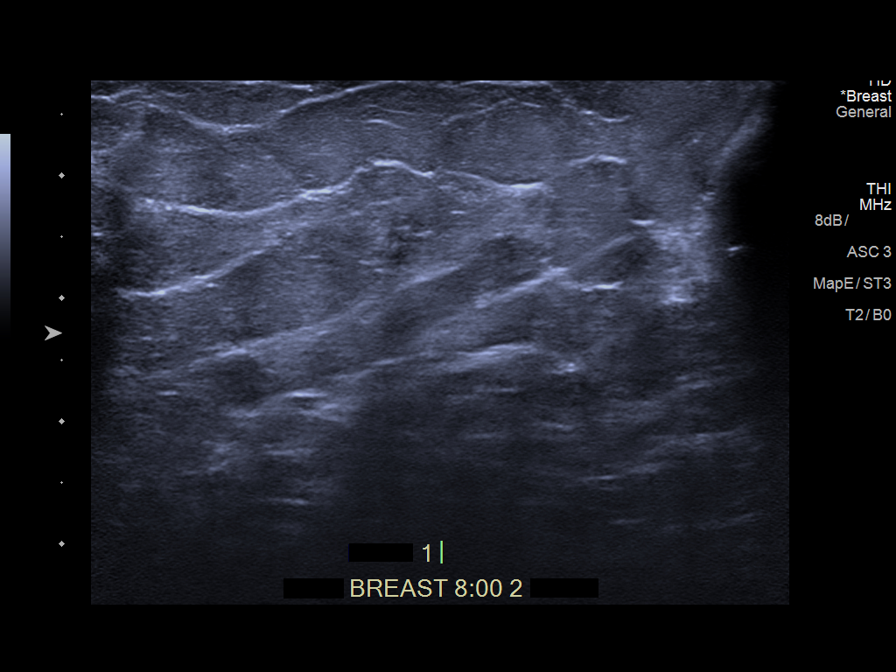
[im 13/16]
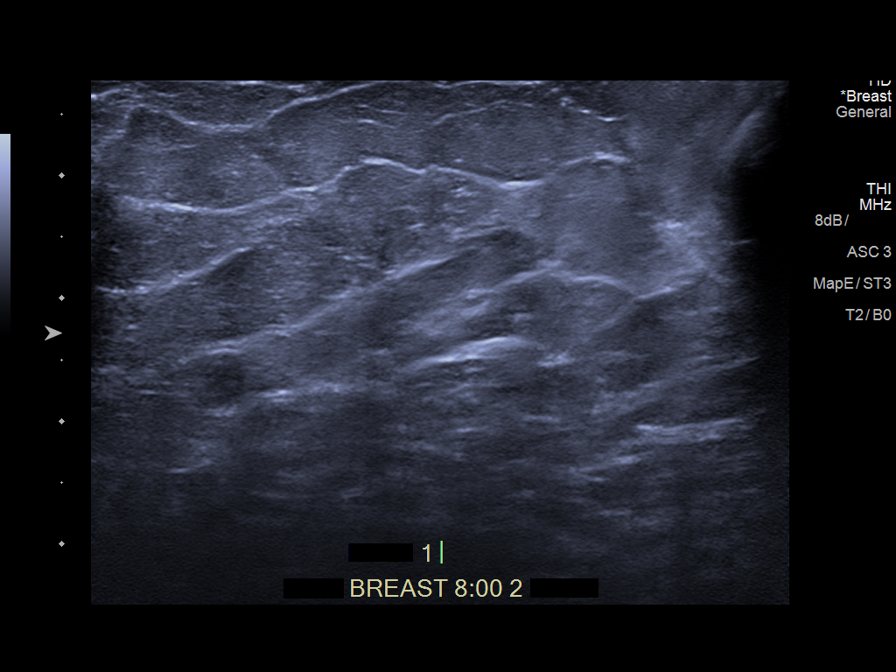
[im 16/16]
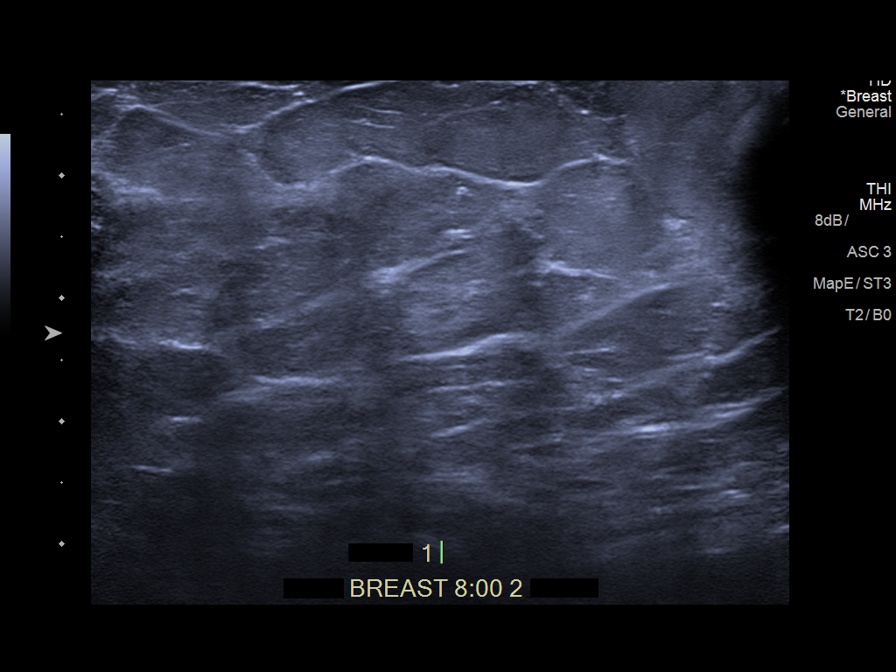

[8 of 8 positions shown; findings below may reference images not displayed]

PROCEDURE:
Using sterile technique, 1% lidocaine, under direct ultrasound
visualization, needle aspiration of the recently demonstrated 6 mm
mass in the 8 o'clock position of the left breast was performed.
With puncture of the mass with a 20 gauge needle, the mass collapsed
and disappeared.
IMPRESSION: Ultrasound-guided aspiration of an 8 mm mildly complicated cyst in
the 8 o'clock position of the left breast. This resulted in complete
resolution of the mass. No apparent complications.

RECOMMENDATIONS:
Bilateral screening mammogram in 1 year.

## 2020-11-04 ENCOUNTER — Ambulatory Visit
Admission: RE | Admit: 2020-11-04 | Discharge: 2020-11-04 | Disposition: A | Discharge status: Home / Self Care | Payer: Medicare Other | Source: Ambulatory Visit | Attending: Physician Assistant | Admitting: Physician Assistant

## 2020-11-04 ENCOUNTER — Other Ambulatory Visit: Payer: Self-pay

## 2020-11-04 ENCOUNTER — Encounter: Payer: Self-pay | Admitting: Physician Assistant

## 2020-11-04 ENCOUNTER — Ambulatory Visit (INDEPENDENT_AMBULATORY_CARE_PROVIDER_SITE_OTHER): Payer: Medicare Other | Admitting: Physician Assistant

## 2020-11-04 VITALS — BP 130/74 | HR 70 | Ht 62.0 in | Wt 147.0 lb

## 2020-11-04 DIAGNOSIS — Z87442 Personal history of urinary calculi: Secondary | ICD-10-CM

## 2020-11-04 DIAGNOSIS — R350 Frequency of micturition: Secondary | ICD-10-CM | POA: Diagnosis present

## 2020-11-04 DIAGNOSIS — R109 Unspecified abdominal pain: Secondary | ICD-10-CM

## 2020-11-04 LAB — URINALYSIS, COMPLETE
Bilirubin, UA: NEGATIVE
Glucose, UA: NEGATIVE
Ketones, UA: NEGATIVE
Nitrite, UA: NEGATIVE
Protein,UA: NEGATIVE
Specific Gravity, UA: <1.005 — ABNORMAL LOW (ref 1.005–1.030)
Urobilinogen, Ur: 0.2 mg/dL (ref 0.2–1.0)
pH, UA: 5.5 (ref 5.0–7.5)

## 2020-11-04 LAB — MICROSCOPIC EXAMINATION: Bacteria, UA: NONE SEEN

## 2020-11-04 LAB — BLADDER SCAN AMB NON-IMAGING

## 2020-11-04 MED ORDER — TAMSULOSIN HCL 0.4 MG PO CAPS: 0.4000 mg | ORAL_CAPSULE | Freq: Every day | ORAL | 0 refills | Status: DC

## 2020-11-04 NOTE — Patient Instructions (Signed)
Go to the radiology desk in [facility] when you leave clinic today to have an abdominal x-ray performed. I will call you to discuss your results when I receive them.

## 2020-11-04 NOTE — Progress Notes (Signed)
11/04/2020 1:18 PM   [name] [name] 1946-02-06 [id number]  CC: Chief Complaint  Patient presents with   Urinary Tract Infection   HPI: [name] [name] is a 74 y.o. female with PMH nephrolithiasis s/p ESWL in 2020 and gross hematuria with negative cystoscopy in 2021 who elected to defer CT urogram who presents today for evaluation of possible UTI.   Today she reports a 4-day history of intermittent left flank pain, urgency, and frequency consistent with past stone episodes.  She denies dysuria, gross hematuria, fever, chills, nausea, and vomiting.  She has been drinking water and cranberry juice at home.  She denies a history of back problems.  Notably, she reports her grandson is getting married next week and she intends to travel out of state next week for 3-4 weeks.  KUB today with no apparent ureteral stone.  In-office UA today positive for trace intact blood and 1+ leukocyte esterase; urine microscopy pan negative.  PVR 0 mL.  PMH: Past Medical History:  Diagnosis Date   Allergic genetic state    Arthritis    Cholelithiasis    Depression    Diarrhea    Fibroids    Hearing loss    Hemorrhoids    Hypothyroidism    IBS (irritable bowel syndrome)    Kidney stones    Osteoarthrosis    Plantar fasciitis    Synovitis     Surgical History: Past Surgical History:  Procedure Laterality Date   BUNIONECTOMY     COLONOSCOPY     COLONOSCOPY WITH PROPOFOL N/A 04/10/2018   Procedure: COLONOSCOPY WITH PROPOFOL;  Surgeon: [name], MD;  Location: [facility] ENDOSCOPY;  Service: Endoscopy;  Laterality: N/A;   CYST EXCISION     mouth   DILATION AND CURETTAGE OF UTERUS     EXTRACORPOREAL SHOCK WAVE LITHOTRIPSY Left 03/29/2019   Procedure: EXTRACORPOREAL SHOCK WAVE LITHOTRIPSY (ESWL);  Surgeon: [name], MD;  Location: [facility] ORS;  Service: Urology;  Laterality: Left;   HARDWARE REMOVAL     JOINT REPLACEMENT     TONSILLECTOMY     WISDOM TOOTH  EXTRACTION      Home Medications:  Allergies as of 11/04/2020      Reactions   Augmentin [amoxicillin-pot Clavulanate] Nausea And Vomiting   Prednisone Nausea And Vomiting   Codeine    Other reaction(s): Unknown   Erythromycin    Other reaction(s): Unknown   Levofloxacin    Omnicef [cefdinir]       Medication List       Accurate as of November 04, 2020  1:18 PM. If you have any questions, ask your nurse or doctor.        STOP taking these medications   doxycycline 100 MG capsule Commonly known as: VIBRAMYCIN Stopped by: Debroah Loop, PA-C   mupirocin ointment 2 % Commonly known as: Bactroban Stopped by: Debroah Loop, PA-C   tamsulosin 0.4 MG Caps capsule Commonly known as: FLOMAX Stopped by: Debroah Loop, PA-C     TAKE these medications   aspirin 81 MG EC tablet Take 81 mg by mouth at bedtime.   Azelastine-Fluticasone 137-50 MCG/ACT Susp Place 1 spray into both nostrils 2 (two) times daily.   cetirizine 10 MG tablet Commonly known as: ZYRTEC Take 1 tablet by mouth daily.   cholecalciferol 25 MCG (1000 UNIT) tablet Commonly known as: VITAMIN D Take 1,000 Units by mouth daily.   Coenzyme Q-10 200 MG Caps Take 1 capsule by mouth daily.   EpiPen  2-Pak 0.3 mg/0.3 mL Soaj injection Generic drug: EPINEPHrine Inject 0.3 mg into the muscle as needed.   NONFORMULARY OR COMPOUNDED ITEM Inject into the muscle once a week. Compounded allergy shot.   pyridOXINE 25 MG tablet Commonly known as: VITAMIN B-6 Take 1 tablet by mouth daily.   Red Yeast Rice Extract 600 MG Caps Take 1 capsule by mouth 2 (two) times daily.   Vitamin B-12 CR 1000 MCG Tbcr Take 1 tablet by mouth daily.   Zinc 100 MG Tabs       Allergies:  Allergies  Allergen Reactions   Augmentin [Amoxicillin-Pot Clavulanate] Nausea And Vomiting   Prednisone Nausea And Vomiting   Codeine     Other reaction(s): Unknown   Erythromycin     Other reaction(s):  Unknown   Levofloxacin    Omnicef [Cefdinir]     Family History: Family History  Problem Relation Age of Onset   Breast cancer Maternal Grandmother        Late 59's   Cancer Mother    Kidney failure Father    Breast cancer Sister 60    Social History:   reports that she has never smoked. She has never used smokeless tobacco. She reports current alcohol use of about 1.0 standard drink of alcohol per week. She reports that she does not use drugs.  Physical Exam: BP 130/74    Pulse 70    Ht _0  (1.575 m)    Wt 147 lb (66.7 kg)    BMI 26.89 kg/m   Constitutional:  Alert and oriented, no acute distress, nontoxic appearing HEENT: [place], AT Cardiovascular: No clubbing, cyanosis, or edema Respiratory: Normal respiratory effort, no increased work of breathing Skin: No rashes, bruises or suspicious lesions Neurologic: Grossly intact, no focal deficits, moving all 4 extremities Psychiatric: Normal mood and affect  Laboratory Data: Results for orders placed or performed in visit on 11/04/20  Microscopic Examination   Urine  Result Value Ref Range   WBC, UA 0-5 0 - 5 /hpf   RBC 0-2 0 - 2 /hpf   Epithelial Cells (non renal) 0-10 0 - 10 /hpf   Bacteria, UA None seen None seen/Few  Urinalysis, Complete  Result Value Ref Range   Specific Gravity, UA <1.005 (L) 1.005 - 1.030   pH, UA 5.5 5.0 - 7.5   Color, UA Yellow Yellow   Appearance Ur Clear Clear   Leukocytes,UA 1+ (A) Negative   Protein,UA Negative Negative/Trace   Glucose, UA Negative Negative   Ketones, UA Negative Negative   RBC, UA Trace (A) Negative   Bilirubin, UA Negative Negative   Urobilinogen, Ur 0.2 0.2 - 1.0 mg/dL   Nitrite, UA Negative Negative   Microscopic Examination See below:   BLADDER SCAN AMB NON-IMAGING  Result Value Ref Range   Scan Result 81m    Pertinent Imaging: KUB, 11/04/2020: CLINICAL DATA:  Left flank pain frequency  EXAM: ABDOMEN - 1 VIEW  COMPARISON:   04/22/2020  FINDINGS: Nonobstructed gas pattern with moderate stool. Probable calcified fibroid in the left pelvis. No radiopaque calculi over the kidneys. Scoliosis.  IMPRESSION: Nonobstructed gas pattern with moderate stool. No definitive radiopaque calculi   Electronically Signed   By: KDonavan FoilM.D.   On: 11/04/2020 23:35  I personally reviewed the imaging above and note no radiopaque ureteral calculi.  Assessment & Plan:   1. Flank pain with history of urolithiasis Sudden onset frequency, urgency, and left flank pain with a history of nephrolithiasis.  Personal review of her KUB reveals no radiopaque stones; UA was benign and VSS in clinic.  Notably, she had no left renal stones on CT stone study dated 03/28/2019.  Differential at this time includes acute stone episode versus MSK pain versus sudden onset OAB.  If she is passing a stone, I suspect it is small in size and she will be able to pass it on her own.  I recommend starting empiric Flomax for MET with plans for CT stone study for further evaluation given her imminent plans to travel out of state. - Urinalysis, Complete - BLADDER SCAN AMB NON-IMAGING - DG Abd 1 View; Future - tamsulosin (FLOMAX) 0.4 MG CAPS capsule; Take 1 capsule (0.4 mg total) by mouth daily.  Dispense: 30 capsule; Refill: 0 - CT RENAL STONE STUDY; Future   Return if symptoms worsen or fail to improve.  Debroah Loop, PA-C  [facility] [address] [place], [place] [address] [phone]

## 2020-11-06 ENCOUNTER — Telehealth: Payer: Self-pay | Admitting: Physician Assistant

## 2020-11-06 ENCOUNTER — Other Ambulatory Visit: Payer: Self-pay

## 2020-11-06 ENCOUNTER — Ambulatory Visit
Admission: RE | Admit: 2020-11-06 | Discharge: 2020-11-06 | Disposition: A | Discharge status: Home / Self Care | Payer: Medicare Other | Source: Ambulatory Visit | Attending: Physician Assistant | Admitting: Physician Assistant

## 2020-11-06 DIAGNOSIS — Z87442 Personal history of urinary calculi: Secondary | ICD-10-CM | POA: Diagnosis present

## 2020-11-06 DIAGNOSIS — R109 Unspecified abdominal pain: Secondary | ICD-10-CM

## 2020-11-06 NOTE — Telephone Encounter (Signed)
Please contact the patient and inform her that her CT scan results show only a tiny, nonobstructing stone of her upper left kidney.  This is not believed to be the source of her pain and she is not actively passing a stone at this time.  She may stop Flomax at this time.  Additionally, her recent urine culture with me has come back negative for UTI.  I can offer her a trial of Myrbetriq 25 mg daily for management of her urinary urgency and frequency with plans for symptom recheck and PVR in 4 weeks.  She may pick up samples in our office on Monday.

## 2020-11-06 NOTE — Telephone Encounter (Signed)
Notified patient as advised. Patient verbalized understanding. As far as the Myrbetriq trial, patient would like to research the drug and side effects over the weekend and call back on Monday to let us know if she would like to try them or not.

## 2020-11-10 NOTE — Telephone Encounter (Signed)
Pt calls triage line to inform us that she will be out of town most of the month of January therefore she will not come by to pick up samples of Myrbetriq. She notes that her symptoms are much better.

## 2020-11-26 ENCOUNTER — Other Ambulatory Visit: Payer: Self-pay | Admitting: Physician Assistant

## 2020-11-26 DIAGNOSIS — R109 Unspecified abdominal pain: Secondary | ICD-10-CM

## 2020-11-26 DIAGNOSIS — Z87442 Personal history of urinary calculi: Secondary | ICD-10-CM

## 2020-12-01 ENCOUNTER — Other Ambulatory Visit: Payer: Self-pay | Admitting: Physician Assistant

## 2020-12-01 DIAGNOSIS — R109 Unspecified abdominal pain: Secondary | ICD-10-CM

## 2020-12-08 ENCOUNTER — Other Ambulatory Visit: Payer: Self-pay | Admitting: Family Medicine

## 2020-12-08 DIAGNOSIS — Z1231 Encounter for screening mammogram for malignant neoplasm of breast: Secondary | ICD-10-CM

## 2020-12-29 ENCOUNTER — Other Ambulatory Visit: Payer: Self-pay

## 2020-12-29 ENCOUNTER — Ambulatory Visit
Admission: RE | Admit: 2020-12-29 | Discharge: 2020-12-29 | Disposition: A | Discharge status: Home / Self Care | Payer: Medicare Other | Source: Ambulatory Visit | Attending: Family Medicine | Admitting: Family Medicine

## 2020-12-29 DIAGNOSIS — Z1231 Encounter for screening mammogram for malignant neoplasm of breast: Secondary | ICD-10-CM

## 2021-04-23 ENCOUNTER — Other Ambulatory Visit: Payer: Self-pay | Admitting: *Deleted

## 2021-04-23 DIAGNOSIS — N200 Calculus of kidney: Secondary | ICD-10-CM

## 2021-04-28 ENCOUNTER — Encounter: Payer: Self-pay | Admitting: Urology

## 2021-04-28 ENCOUNTER — Ambulatory Visit
Admission: RE | Admit: 2021-04-28 | Discharge: 2021-04-28 | Disposition: A | Discharge status: Home / Self Care | Payer: Medicare Other | Source: Ambulatory Visit | Attending: Urology | Admitting: Urology

## 2021-04-28 ENCOUNTER — Other Ambulatory Visit: Payer: Self-pay

## 2021-04-28 ENCOUNTER — Ambulatory Visit: Payer: Medicare Other | Admitting: Urology

## 2021-04-28 VITALS — BP 149/80 | HR 80 | Ht 62.0 in | Wt 147.0 lb

## 2021-04-28 DIAGNOSIS — Z87448 Personal history of other diseases of urinary system: Secondary | ICD-10-CM | POA: Diagnosis not present

## 2021-04-28 DIAGNOSIS — N200 Calculus of kidney: Secondary | ICD-10-CM | POA: Diagnosis not present

## 2021-04-28 NOTE — Progress Notes (Signed)
04/28/2021 3:38 PM   [name] [name] Feb 13, 1946 [id number]  Referring provider: [name], MD [place],  [place] [address]  Chief Complaint  Patient presents with   Nephrolithiasis    HPI: 75 year old female with a personal history of kidney stones and gross hematuria who returns today for routine annual follow-up.  This year, she has been doing well.  She has no issues.  She did see Debroah Loop in December with some increased urinary issues along with left flank pain.  KUB was negative.  Her symptoms resolved quickly.  She did have a getting a noncontrast CT scan which showed a punctate left upper pole stone but no acute stone disease.  Today, she is asymptomatic.  She denies any dysuria, urgency or frequency.  No flank pain.  No further episodes of gross hematuria.  She is had no further gross episode issues or microscopic blood on serial urinalyses.   PMH: Past Medical History:  Diagnosis Date   Allergic genetic state    Arthritis    Cholelithiasis    Depression    Diarrhea    Fibroids    Hearing loss    Hemorrhoids    Hypothyroidism    IBS (irritable bowel syndrome)    Kidney stones    Osteoarthrosis    Plantar fasciitis    Synovitis     Surgical History: Past Surgical History:  Procedure Laterality Date   BREAST CYST ASPIRATION Left 12/14/2019   BUNIONECTOMY     COLONOSCOPY     COLONOSCOPY WITH PROPOFOL N/A 04/10/2018   Procedure: COLONOSCOPY WITH PROPOFOL;  Surgeon: [name], MD;  Location: [facility] ENDOSCOPY;  Service: Endoscopy;  Laterality: N/A;   CYST EXCISION     mouth   DILATION AND CURETTAGE OF UTERUS     EXTRACORPOREAL SHOCK WAVE LITHOTRIPSY Left 03/29/2019   Procedure: EXTRACORPOREAL SHOCK WAVE LITHOTRIPSY (ESWL);  Surgeon: [name], MD;  Location: [facility] ORS;  Service: Urology;  Laterality: Left;   HARDWARE REMOVAL     JOINT REPLACEMENT     TONSILLECTOMY     WISDOM TOOTH EXTRACTION      Home  Medications:  Allergies as of 04/28/2021       Reactions   Augmentin [amoxicillin-pot Clavulanate] Nausea And Vomiting   Prednisone Nausea And Vomiting   Codeine    Other reaction(s): Unknown   Erythromycin    Other reaction(s): Unknown   Levofloxacin    Omnicef [cefdinir]         Medication List        Accurate as of April 28, 2021  3:38 PM. If you have any questions, ask your nurse or doctor.          STOP taking these medications    tamsulosin 0.4 MG Caps capsule Commonly known as: FLOMAX Stopped by: Hollice Espy, MD       TAKE these medications    aspirin 81 MG EC tablet Take 81 mg by mouth at bedtime.   Azelastine-Fluticasone 137-50 MCG/ACT Susp Place 1 spray into both nostrils 2 (two) times daily.   cetirizine 10 MG tablet Commonly known as: ZYRTEC Take 1 tablet by mouth daily.   cholecalciferol 25 MCG (1000 UNIT) tablet Commonly known as: VITAMIN D Take 1,000 Units by mouth daily.   Coenzyme Q-10 200 MG Caps Take 1 capsule by mouth daily.   EpiPen 2-Pak 0.3 mg/0.3 mL Soaj injection Generic drug: EPINEPHrine Inject 0.3 mg into the muscle as needed.  NONFORMULARY OR COMPOUNDED ITEM Inject into the muscle once a week. Compounded allergy shot.   pyridOXINE 25 MG tablet Commonly known as: VITAMIN B-6 Take 1 tablet by mouth daily.   Red Yeast Rice Extract 600 MG Caps Take 1 capsule by mouth 2 (two) times daily.   Vitamin B-12 CR 1000 MCG Tbcr Take 1 tablet by mouth daily.   Zinc 100 MG Tabs        Allergies:  Allergies  Allergen Reactions   Augmentin [Amoxicillin-Pot Clavulanate] Nausea And Vomiting   Prednisone Nausea And Vomiting   Codeine     Other reaction(s): Unknown   Erythromycin     Other reaction(s): Unknown   Levofloxacin    Omnicef [Cefdinir]     Family History: Family History  Problem Relation Age of Onset   Breast cancer Maternal Grandmother        Late 12's   Cancer Mother    Kidney failure Father     Breast cancer Sister 20    Social History:  reports that she has never smoked. She has never used smokeless tobacco. She reports current alcohol use of about 1.0 standard drink of alcohol per week. She reports that she does not use drugs.   Physical Exam: BP (!) 149/80   Pulse 80   Ht 5\' 2"  (1.575 m)   Wt 147 lb (66.7 kg)   BMI 26.89 kg/m   Constitutional:  Alert and oriented, No acute distress. HEENT: [place] AT, moist mucus membranes.  Trachea midline, no masses. Cardiovascular: No clubbing, cyanosis, or edema. Respiratory: Normal respiratory effort, no increased work of breathing. Skin: No rashes, bruises or suspicious lesions. Neurologic: Grossly intact, no focal deficits, moving all 4 extremities. Psychiatric: Normal mood and affect.   Pertinent Imaging: KUB that was performed today was personally reviewed.  Radiologic interpretation is pending but no obvious stones were visualized.  This was compared to her CT scan from 10/2020 with a punctate left upper pole stone, otherwise no other stones.  Assessment & Plan:    1. Kidney stones No further stone episodes with very minimal stone burden on most recent CT scan; none seen on KUB today  We discussed stone prevention techniques  At this point time, is reasonable to follow her clinically and see as needed  2. History of hematuria No further episodes status post negative evaluation in the past  I have asked her to continue to have a serial urinalysis by her PCP and refer back if she has any further gross or microscopic blood for reevaluation   F/u prn  Hollice Espy, MD  [facility] [address], [place], [place] [address] [phone]  I spent 30 total minutes on the day of the encounter including pre-visit review of the medical record, face-to-face time with the patient, and post visit ordering of labs/imaging/tests.

## 2021-08-29 IMAGING — CT CT RENAL STONE PROTOCOL
2 of 4 series · 15 of 46 positions shown, 17 images · non-contrast
Comparison: CT the abdomen and pelvis 03/28/2019.

CLINICAL DATA: 74-year-old female with history of left-sided flank
pain. History of kidney stones status post lithotripsy. No gross
hematuria.

EXAM:
CT ABDOMEN AND PELVIS WITHOUT CONTRAST
TECHNIQUE: Multidetector CT imaging of the abdomen and pelvis was performed
following the standard protocol without IV contrast.

[Series 2: renal stone 5.00 · axial · 0.77mm/px · z∈[-1444,-1069]mm · 12 of 83 slices shown, 14 images]
[im 4/83  soft-tissue]
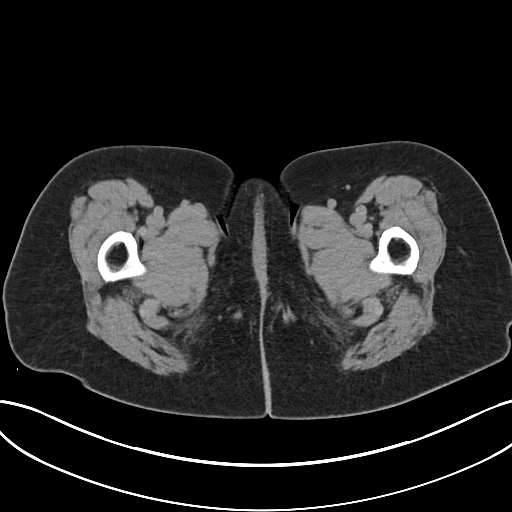
[im 4/83  bone]
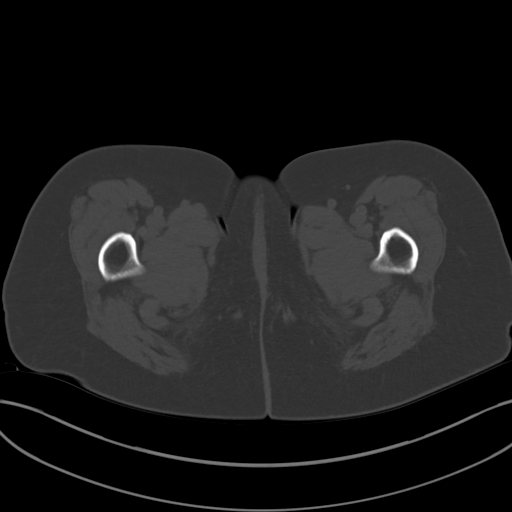
[im 11/83  soft-tissue]
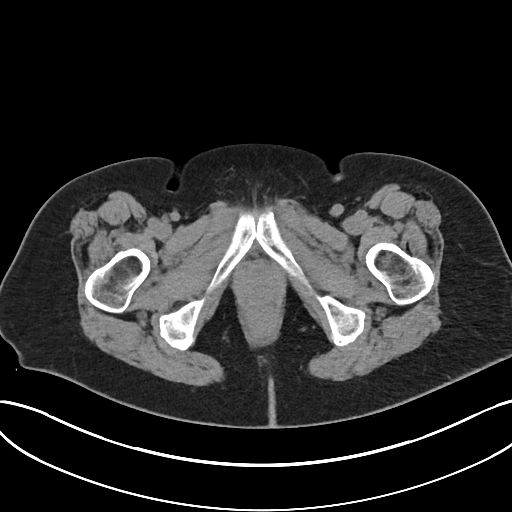
[im 18/83  soft-tissue]
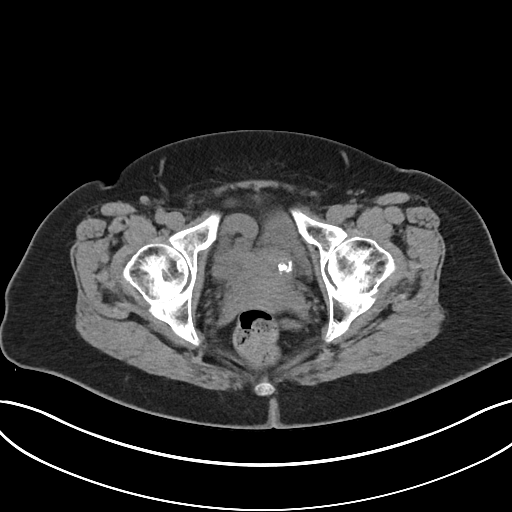
[im 24/83  soft-tissue]
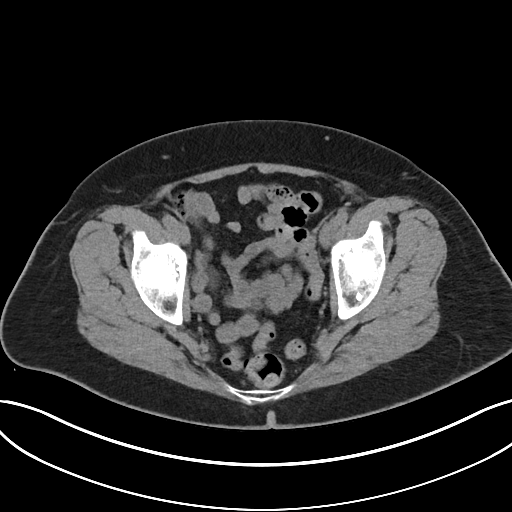
[im 31/83  soft-tissue]
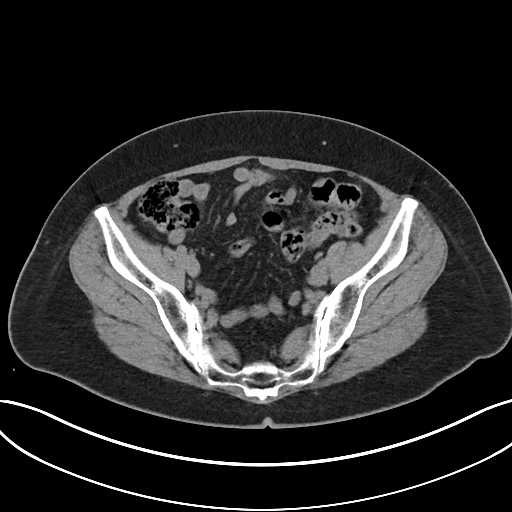
[im 38/83  soft-tissue]
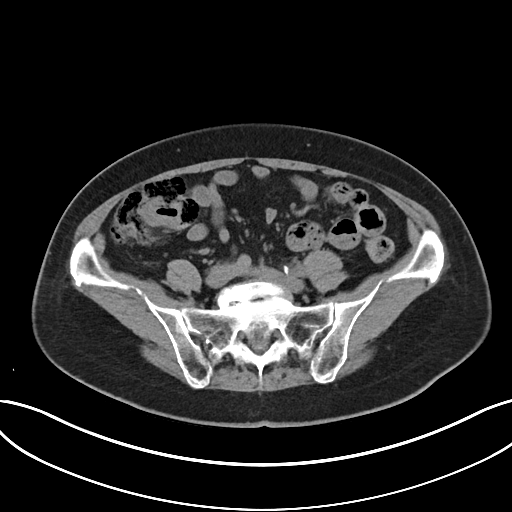
[im 45/83  soft-tissue]
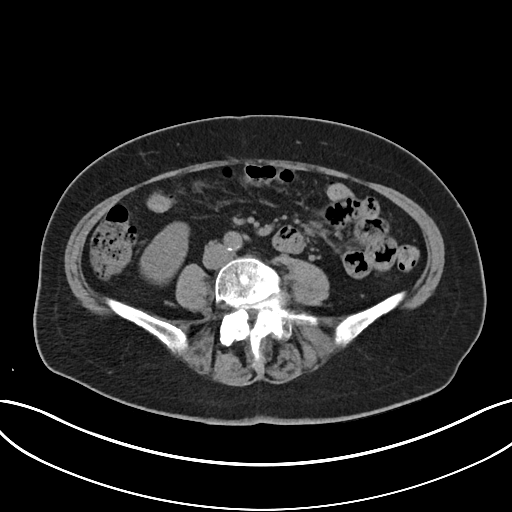
[im 52/83  soft-tissue]
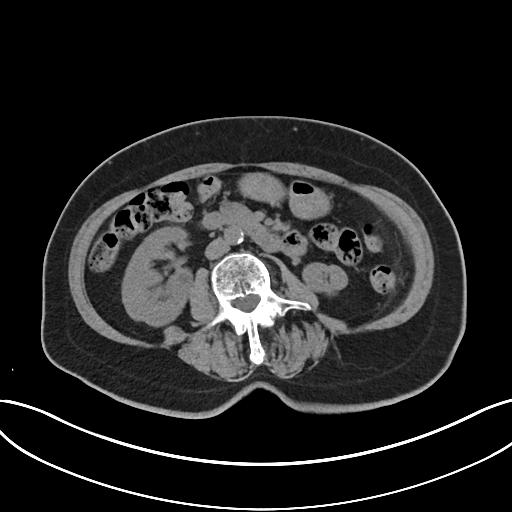
[im 59/83  soft-tissue]
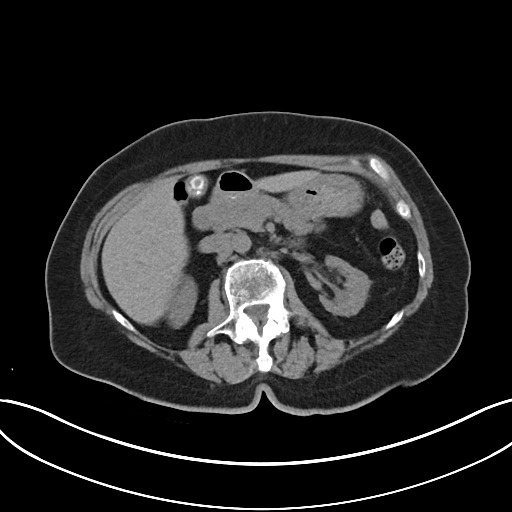
[im 59/83  bone]
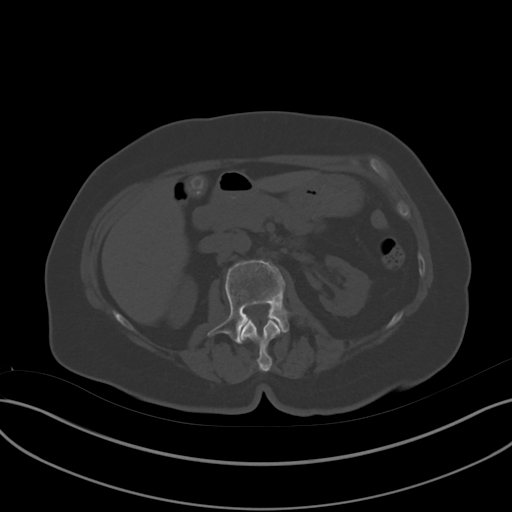
[im 65/83  soft-tissue]
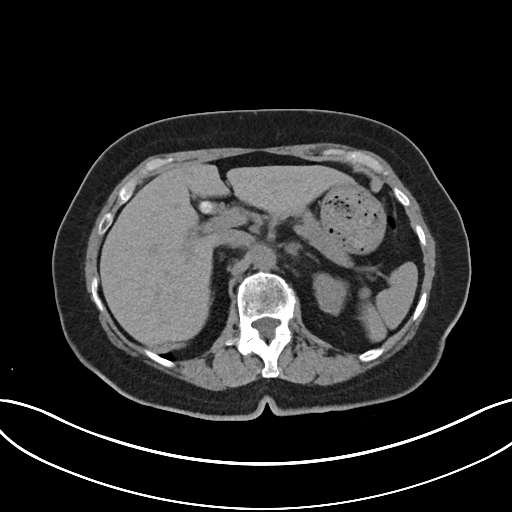
[im 72/83  soft-tissue]
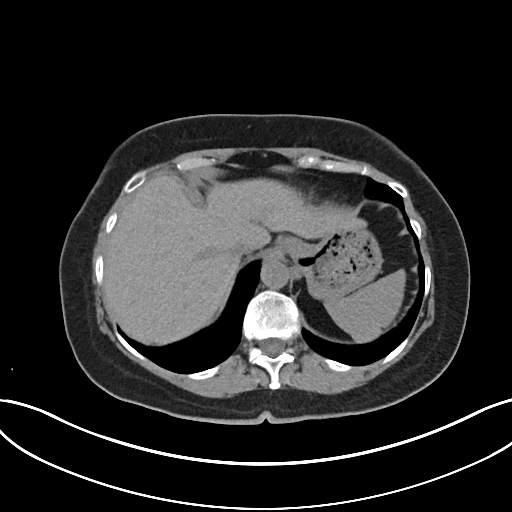
[im 79/83  soft-tissue]
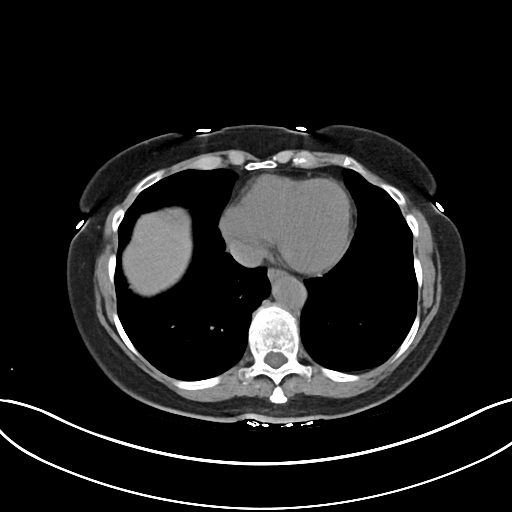

[Series 4: renal stone 2.00 cor · coronal · 0.77mm/px · 3 of 125 slices shown]
[im 42/125  soft-tissue]
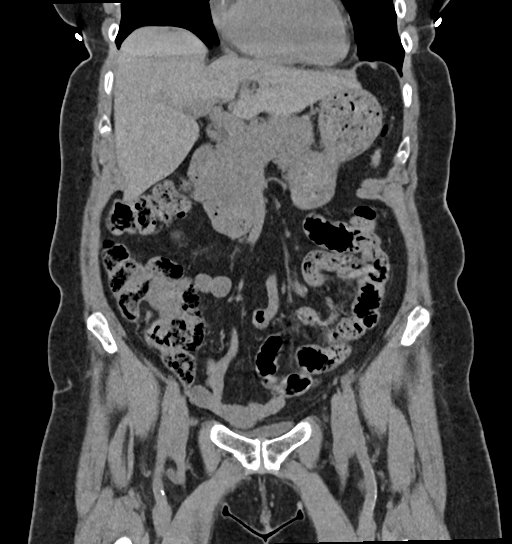
[im 56/125  soft-tissue]
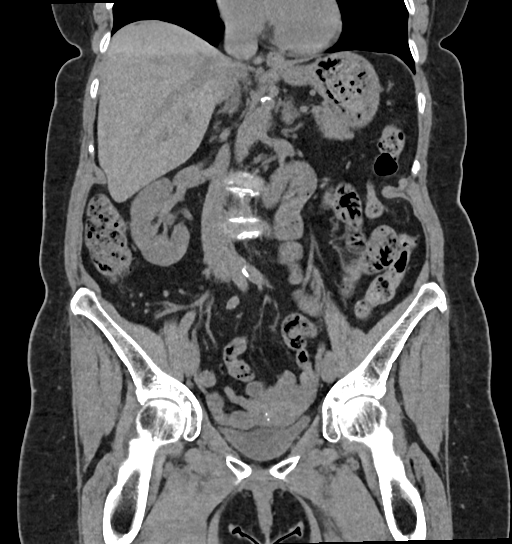
[im 69/125  soft-tissue]
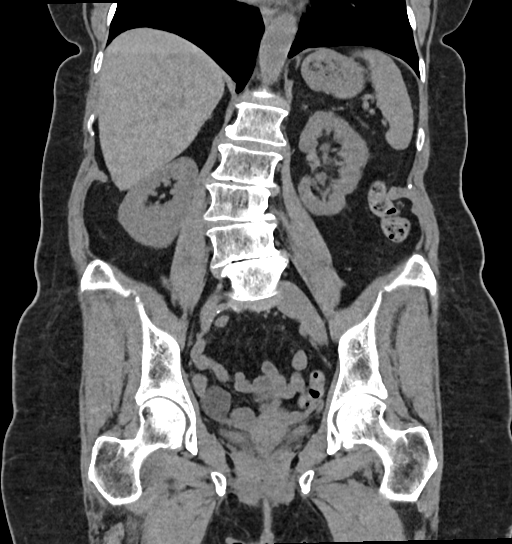

[15 of 46 positions shown; findings below may reference images not displayed]

FINDINGS: Lower chest: Atherosclerotic calcifications in the descending
thoracic aorta and the right coronary artery.

Hepatobiliary: No definite suspicious cystic or solid hepatic
lesions. No intra or extrahepatic biliary ductal dilatation.
Innumerable peripherally calcified gallstones are noted within the
gallbladder which appears completely decompressed around the
indwelling stones. No surrounding inflammatory changes or
pericholecystic fluid to suggest an acute cholecystitis at this
time.

Pancreas: No definite pancreatic mass or peripancreatic fluid
collections or inflammatory changes are confidently identified on
today's noncontrast CT examination.

Spleen: Unremarkable.

Adrenals/Urinary Tract: 3 mm nonobstructive calculus in the upper
pole collecting system of the left kidney. No additional calculi are
noted within the right renal collecting system, along the course of
either ureter, or within the lumen of the urinary bladder. No
hydroureteronephrosis. Unenhanced appearance of the kidneys and
bilateral adrenal glands is otherwise normal. Urinary bladder is
nearly completely decompressed, but otherwise unremarkable in
appearance.

Stomach/Bowel: Unenhanced appearance of the stomach is normal. There
is no pathologic dilatation of small bowel or colon. The appendix is
not confidently identified and may be surgically absent. Regardless,
there are no inflammatory changes noted adjacent to the cecum to
suggest the presence of an acute appendicitis at this time.

Vascular/Lymphatic: Aortic atherosclerosis. No lymphadenopathy noted
in the abdomen or pelvis.

Reproductive: Densely calcified lesion in the uterine body measuring
1.9 cm in diameter, most compatible with a calcified fibroid. Uterus
and ovaries are atrophic, uterus and left ovary are otherwise
atrophic and unremarkable in appearance. In the right adnexa (axial
image 62 of series 2 and coronal image 68 of series 4) there is a
well-circumscribed 2.9 x 2.3 x 2.5 cm low-attenuation lesion which
is incompletely characterized on today's non-contrast CT
examination, but similar to prior study from 03/28/2019, presumably
a benign ovarian cyst.

Other: No significant volume of ascites.  No pneumoperitoneum.

Musculoskeletal: There are no aggressive appearing lytic or blastic
lesions noted in the visualized portions of the skeleton.
IMPRESSION: 1. 3 mm nonobstructive calculus in the upper pole collecting system
of left kidney. No ureteral stones or findings of urinary tract
obstruction are noted at this time.
2. Cholelithiasis without evidence of acute cholecystitis at this
time.
3. Aortic atherosclerosis, in addition to at least right coronary
artery disease. Assessment for potential risk factor modification,
dietary therapy or pharmacologic therapy may be warranted, if
clinically indicated.
4. Additional incidental findings, as above.

## 2021-12-23 ENCOUNTER — Other Ambulatory Visit: Payer: Self-pay | Admitting: Family Medicine

## 2021-12-23 DIAGNOSIS — Z1231 Encounter for screening mammogram for malignant neoplasm of breast: Secondary | ICD-10-CM

## 2022-02-02 ENCOUNTER — Ambulatory Visit
Admission: RE | Admit: 2022-02-02 | Discharge: 2022-02-02 | Disposition: A | Discharge status: Home / Self Care | Payer: Medicare PPO | Source: Ambulatory Visit | Attending: Family Medicine | Admitting: Family Medicine

## 2022-02-02 ENCOUNTER — Other Ambulatory Visit: Payer: Self-pay

## 2022-02-02 DIAGNOSIS — Z1231 Encounter for screening mammogram for malignant neoplasm of breast: Secondary | ICD-10-CM | POA: Diagnosis not present

## 2022-02-18 IMAGING — CR DG ABDOMEN 1V
1 series · 2 of 2 positions shown · non-contrast
Comparison: 11/06/2020 CT

CLINICAL DATA: Kidney stones

EXAM:
ABDOMEN - 1 VIEW

[Series 1: dg abd 1 view · 0.14mm/px · 2 of 2 slices shown]
[im 1/2]
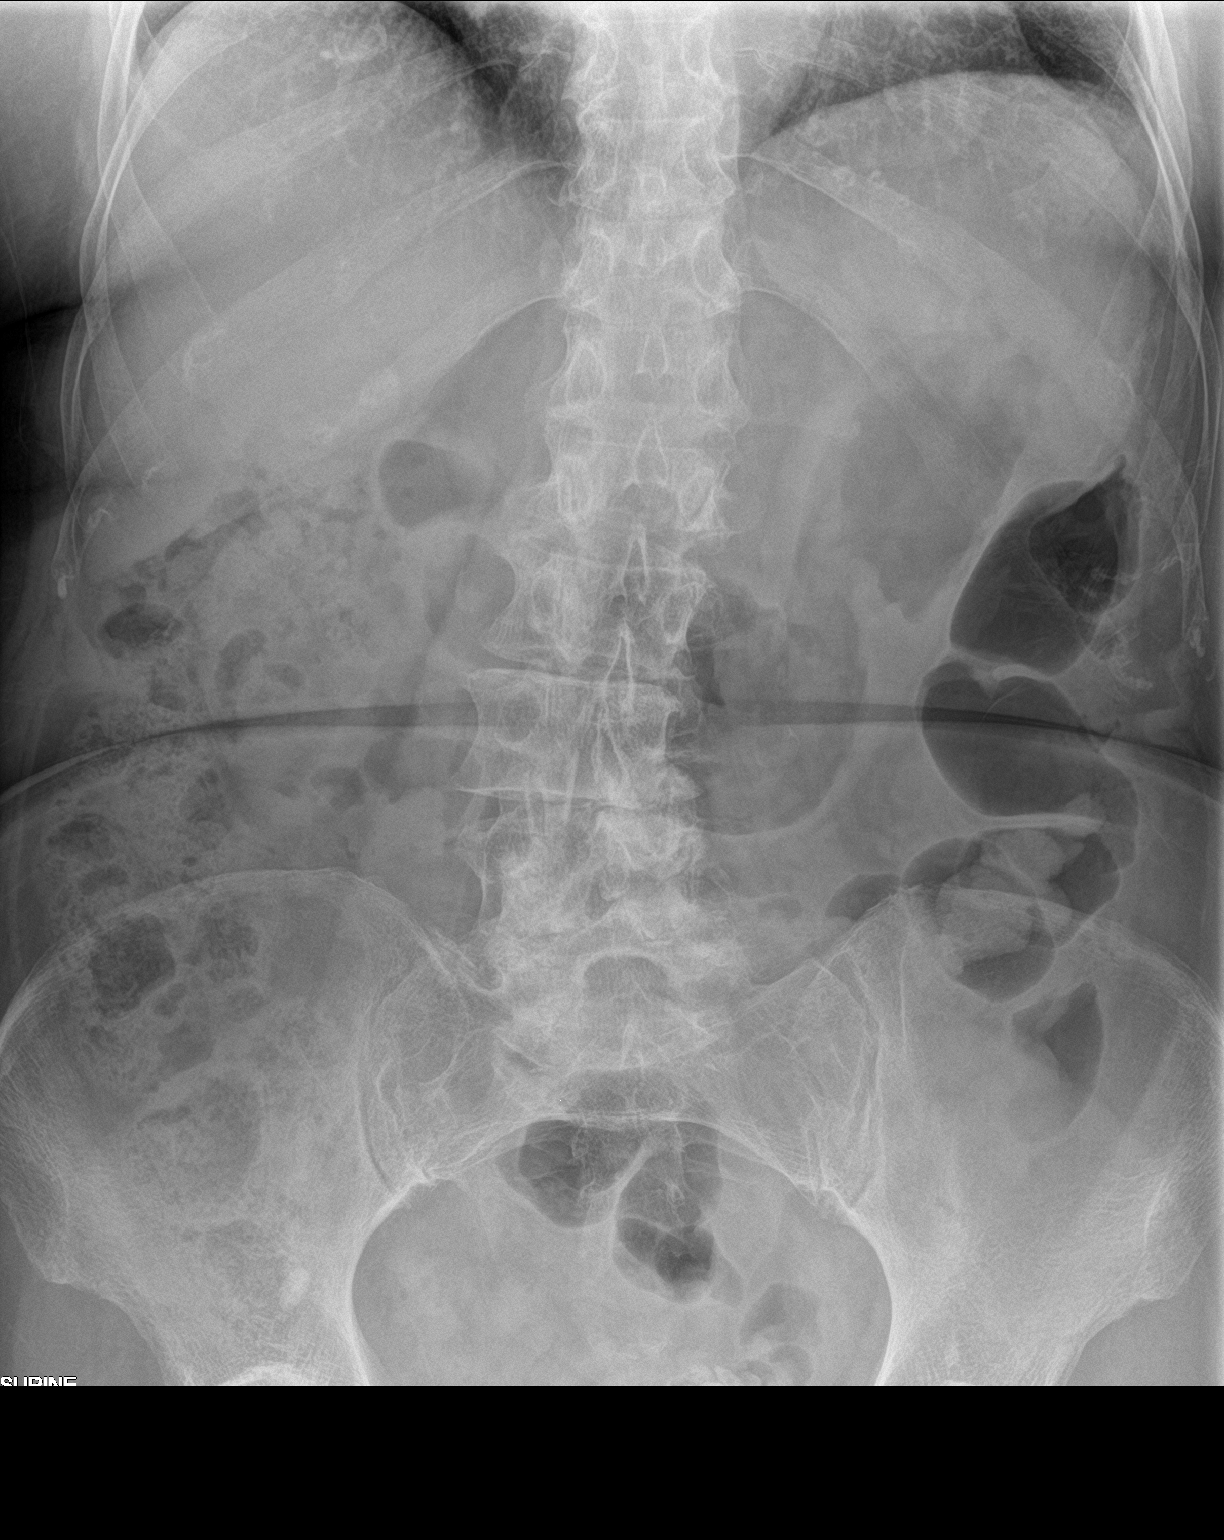
[im 2/2]
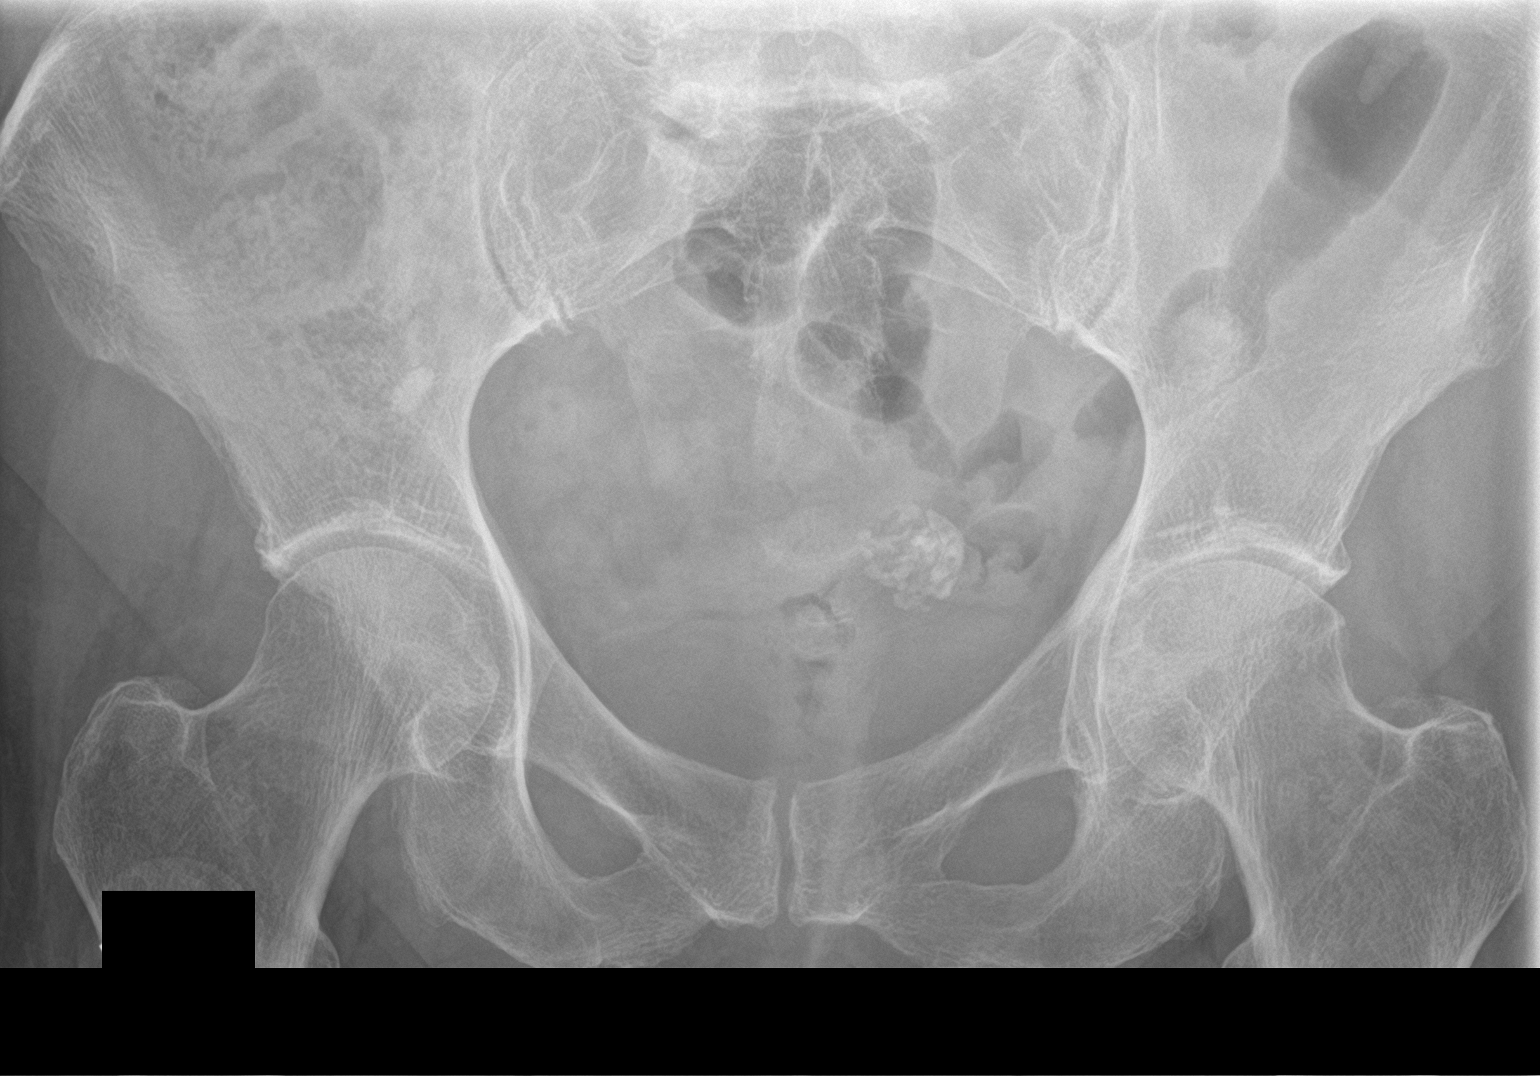

[2 of 2 positions shown; findings below may reference images not displayed]

FINDINGS: Scattered large and small bowel gas is noted. No abnormal mass or
abnormal calcifications are seen. Previously noted tiny left renal
stone is not well appreciated on this exam. Calcified uterine
fibroid is again noted. Degenerative changes of the lumbar spine are
seen.
IMPRESSION: No acute abnormality noted. Previously seen left renal stone is not
well appreciated on this exam.

## 2022-07-23 ENCOUNTER — Other Ambulatory Visit: Payer: Self-pay | Admitting: Unknown Physician Specialty

## 2022-07-23 DIAGNOSIS — R42 Dizziness and giddiness: Secondary | ICD-10-CM

## 2022-07-26 ENCOUNTER — Emergency Department
Admission: EM | Admit: 2022-07-26 | Discharge: 2022-07-27 | Disposition: A | Discharge status: Home / Self Care | Payer: Medicare PPO | Attending: Emergency Medicine | Admitting: Emergency Medicine

## 2022-07-26 ENCOUNTER — Emergency Department: Payer: Medicare PPO

## 2022-07-26 ENCOUNTER — Other Ambulatory Visit: Payer: Self-pay

## 2022-07-26 ENCOUNTER — Encounter: Payer: Self-pay | Admitting: Intensive Care

## 2022-07-26 DIAGNOSIS — R42 Dizziness and giddiness: Secondary | ICD-10-CM | POA: Diagnosis present

## 2022-07-26 DIAGNOSIS — E039 Hypothyroidism, unspecified: Secondary | ICD-10-CM | POA: Insufficient documentation

## 2022-07-26 DIAGNOSIS — D72819 Decreased white blood cell count, unspecified: Secondary | ICD-10-CM | POA: Insufficient documentation

## 2022-07-26 DIAGNOSIS — R82998 Other abnormal findings in urine: Secondary | ICD-10-CM

## 2022-07-26 LAB — BASIC METABOLIC PANEL
Anion gap: 13 (ref 5–15)
BUN: 21 mg/dL (ref 8–23)
CO2: 24 mmol/L (ref 22–32)
Calcium: 9 mg/dL (ref 8.9–10.3)
Chloride: 105 mmol/L (ref 98–111)
Creatinine, Ser: 0.8 mg/dL (ref 0.44–1.00)
GFR, Estimated: >60 mL/min (ref >60)
Glucose, Bld: 106 mg/dL — ABNORMAL HIGH (ref 70–99)
Potassium: 3.9 mmol/L (ref 3.5–5.1)
Sodium: 142 mmol/L (ref 135–145)

## 2022-07-26 LAB — CBC
HCT: 45.2 % (ref 36.0–46.0)
Hemoglobin: 14.9 g/dL (ref 12.0–15.0)
MCH: 31 pg (ref 26.0–34.0)
MCHC: 33 g/dL (ref 30.0–36.0)
MCV: 94.2 fL (ref 80.0–100.0)
Platelets: 297 10*3/uL (ref 150–400)
RBC: 4.8 MIL/uL (ref 3.87–5.11)
RDW: 13.6 % (ref 11.5–15.5)
WBC: 7.5 10*3/uL (ref 4.0–10.5)
nRBC: 0 % (ref 0.0–0.2)

## 2022-07-26 LAB — URINALYSIS, ROUTINE W REFLEX MICROSCOPIC
Bilirubin Urine: NEGATIVE
Glucose, UA: NEGATIVE mg/dL
Hgb urine dipstick: NEGATIVE
Ketones, ur: 5 mg/dL — AB
Nitrite: NEGATIVE
Protein, ur: NEGATIVE mg/dL
Specific Gravity, Urine: >1.046 — ABNORMAL HIGH (ref 1.005–1.030)
pH: 6 (ref 5.0–8.0)

## 2022-07-26 LAB — TROPONIN I (HIGH SENSITIVITY): Troponin I (High Sensitivity): 4 ng/L (ref <18)

## 2022-07-26 LAB — TSH: TSH: 1.845 u[IU]/mL (ref 0.350–4.500)

## 2022-07-26 MED ORDER — IOHEXOL 350 MG/ML SOLN
75.0000 mL | Freq: Once | INTRAVENOUS | Status: AC | PRN
  Administered 2022-07-26: 75 mL via INTRAVENOUS

## 2022-07-26 MED ORDER — MECLIZINE HCL 25 MG PO TABS
25.0000 mg | ORAL_TABLET | Freq: Once | ORAL | Status: AC
  Administered 2022-07-26: 25 mg via ORAL
  Filled 2022-07-26: qty 1

## 2022-07-26 MED ORDER — SULFAMETHOXAZOLE-TRIMETHOPRIM 800-160 MG PO TABS
1.0000 | ORAL_TABLET | Freq: Once | ORAL | Status: AC
  Administered 2022-07-26: 1 via ORAL
  Filled 2022-07-26: qty 1

## 2022-07-26 NOTE — ED Triage Notes (Signed)
Patient arrived by EMS from home. C/o lightheaded, weakness, nauseas, and pressure in head. Reports symptoms have been ongoing for months. Saw ENT last week and waiting on MRI appointment to be scheduled.    Appointment scheduled with rheumatologist for Wednesday.  EMS administered '4mg'$  zofran

## 2022-07-26 NOTE — ED Provider Notes (Incomplete)
[facility] Provider Note    Event Date/Time   First MD Initiated Contact with Patient 07/26/22 1918     (approximate)   History   No chief complaint on file.   HPI  [name] [name] is a 76 y.o. female   Past medical history of hypothyroidism, kidney stones, 6 months of transient intermittent room spinning sensation for which she sees ENT.  During this time she is also felt a sensation of ear fullness on the left side.  These attacks have been 5 to 20 minutes at a time happening once every 2 weeks, worse with movement, no other exacerbating or alleviating symptoms.  ENT has scheduled her for an MRI of the brain which she has not yet completed.  Today she was sitting at lunch around 1230 when she had another attack of dizziness though this time it was accompanied by nausea and had a sensation of imbalance rather than room spinning.  She has had no other recent illnesses, no trauma, denies chest pain, shortness of breath, fever.  She took a Benadryl and went to sleep in the afternoon and now feels somewhat better but with continued symptoms.    History was obtained via patient and a review of external medical notes.      Physical Exam   Triage Vital Signs: ED Triage Vitals  Enc Vitals Group     BP 07/26/22 1811 (!) 170/61     Pulse Rate 07/26/22 1811 69     Resp 07/26/22 1811 16     Temp 07/26/22 1811 97.9 F (36.6 C)     Temp Source 07/26/22 1811 Oral     SpO2 07/26/22 1811 94 %     Weight 07/26/22 1812 154 lb (69.9 kg)     Height 07/26/22 1812 '5\' 4"'$  (1.626 m)     Head Circumference --      Peak Flow --      Pain Score 07/26/22 1812 0     Pain Loc --      Pain Edu? --      Excl. in [place]? --     Most recent vital signs: Vitals:   07/26/22 2036 07/26/22 2100  BP: 138/76 (!) 153/75  Pulse: 74 64  Resp: 13 14  Temp:    SpO2: 100% 100%    General: Awake, no distress.  Pleasant and cooperative CV:  Good peripheral perfusion.  Resp:  Normal  effort. Lungs clear.  Abd:  No distention. Non tender Other:  Extraocular movements intact, pupils equal round and reactive, no visual deficits or field cuts, no facial asymmetry, motor and sensory exam is normal, normal finger-to-nose.  Her gait is steady.  She does feel a sense of imbalance when walking and especially when closing her eyes.  Neg Nystagmus.   ED Results / Procedures / Treatments   Labs (all labs ordered are listed, but only abnormal results are displayed) Labs Reviewed  BASIC METABOLIC PANEL - Abnormal; Notable for the following components:      Result Value   Glucose, Bld 106 (*)    All other components within normal limits  URINALYSIS, ROUTINE W REFLEX MICROSCOPIC - Abnormal; Notable for the following components:   Color, Urine YELLOW (*)    APPearance HAZY (*)    Specific Gravity, Urine >1.046 (*)    Ketones, ur 5 (*)    Leukocytes,Ua LARGE (*)    Bacteria, UA RARE (*)    All other components within normal limits  CBC  TSH  TROPONIN I (HIGH SENSITIVITY)     I reviewed labs and they are notable for glucose 106  EKG  ED ECG REPORT I, Lucillie Garfinkel, the attending physician, personally viewed and interpreted this ECG.   Date: 07/26/2022  EKG Time: 1815  Rate: 65  Rhythm: normal sinus rhythm  Axis: nl  Intervals:no ischemic changes    RADIOLOGY I independently reviewed and interpreted CT head    PROCEDURES:  Critical Care performed: No  Procedures   MEDICATIONS ORDERED IN ED: Medications  meclizine (ANTIVERT) tablet 25 mg (25 mg Oral Given 07/26/22 2033)  iohexol (OMNIPAQUE) 350 MG/ML injection 75 mL (75 mLs Intravenous Contrast Given 07/26/22 1953)  sulfamethoxazole-trimethoprim (BACTRIM DS) 800-160 MG per tablet 1 tablet (1 tablet Oral Given 07/26/22 2332)     IMPRESSION / MDM / ASSESSMENT AND PLAN / ED COURSE  I reviewed the triage vital signs and the nursing notes.                              Differential diagnosis includes, but is  not limited to, peripheral versus central vertigo, electrolyte derangement, ACS less likely.  Likely peripheral vertigo given exam as above, exacerbated by head movements.  However given ongoing symptoms and change in quality today with some imbalance while walking, will check CTA of the head and give meclizine for peripheral vertigo.  If ongoing symptoms can perform her MRI which she is scheduled outpatient expedited in the emergency department today.    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  Pt w ongoing dizziness mild though able to ambulate for transfer to bathroom. MRI pending to better characterize mass lesion. Signed out pending MRI results and reassessment of pt for dispo decision.   12:50 AM Shortly MRI was negative for any acute pathology, did not see any masses Patient stable, ambulatory to the bathroom. Symptoms much improved with Antivert. Plan for discharge home with treatment for bacteria in the urine in case early urinary tract infection could be contributory to symptoms. Follow-up with PMD tomorrow, return precautions given.  Patient's presentation is most consistent with acute presentation with potential threat to life or bodily function.       FINAL CLINICAL IMPRESSION(S) / ED DIAGNOSES   Final diagnoses:  Leukocytes in urine  Dizzy spells     Rx / DC Orders   ED Discharge Orders          Ordered    sulfamethoxazole-trimethoprim (BACTRIM DS) 800-160 MG tablet  2 times daily        07/27/22 0035    meclizine (ANTIVERT) 12.5 MG tablet  3 times daily PRN        07/27/22 0049             Note:  This document was prepared using Dragon voice recognition software and may include unintentional dictation errors.    Lucillie Garfinkel, MD 07/27/22 0630    Lucillie Garfinkel, MD 07/27/22 [phone]

## 2022-07-27 MED ORDER — SULFAMETHOXAZOLE-TRIMETHOPRIM 800-160 MG PO TABS: 1.0000 | ORAL_TABLET | Freq: Two times a day (BID) | ORAL | 0 refills | Status: DC

## 2022-07-27 MED ORDER — MECLIZINE HCL 12.5 MG PO TABS: 12.5000 mg | ORAL_TABLET | Freq: Three times a day (TID) | ORAL | 0 refills | Status: DC | PRN

## 2022-07-27 MED ORDER — MECLIZINE HCL 12.5 MG PO TABS: 12.5000 mg | ORAL_TABLET | Freq: Three times a day (TID) | ORAL | 0 refills | Status: AC | PRN

## 2022-07-27 MED ORDER — SULFAMETHOXAZOLE-TRIMETHOPRIM 800-160 MG PO TABS: 1.0000 | ORAL_TABLET | Freq: Two times a day (BID) | ORAL | 0 refills | Status: AC

## 2022-07-27 NOTE — Discharge Instructions (Addendum)
Take antibiotics for full course as prescribed.  Take antivert as needed.   Follow up with your doctor for a check up of your brain imaging and referral to a brain specialist as needed. You can call the neurosurgeon ([facility]) whose number I attached in your discharge paperwork for an appointment as well.   Thank you for choosing [place] for your health care today!  Please see your primary doctor this week for a follow up appointment.   If you do not have a primary doctor call the following clinics to establish care:  If you have insurance:  [facility] [phone] [place] [address]   [facility]  [phone] [address] [place] [address]   If you do not have insurance:  [facility]  [phone] [address], [place] [address]  Sometimes, in the early stages of certain disease courses it is difficult to detect in the emergency department evaluation -- so, it is important that you continue to monitor your symptoms and call your doctor right away or return to the emergency department if you develop any new or worsening symptoms.  It was my pleasure to care for you today.   Hoover Brunette Jacelyn Grip, MD

## 2022-08-05 ENCOUNTER — Ambulatory Visit: Payer: Medicare PPO | Admitting: Physician Assistant

## 2022-08-05 ENCOUNTER — Ambulatory Visit
Admission: RE | Admit: 2022-08-05 | Discharge: 2022-08-05 | Disposition: A | Discharge status: Home / Self Care | Payer: Medicare PPO | Attending: Physician Assistant | Admitting: Physician Assistant

## 2022-08-05 ENCOUNTER — Encounter: Payer: Self-pay | Admitting: Physician Assistant

## 2022-08-05 ENCOUNTER — Ambulatory Visit
Admission: RE | Admit: 2022-08-05 | Discharge: 2022-08-05 | Disposition: A | Discharge status: Home / Self Care | Payer: Medicare PPO | Source: Ambulatory Visit | Attending: Physician Assistant | Admitting: Physician Assistant

## 2022-08-05 VITALS — BP 138/80 | HR 86 | Ht 64.0 in | Wt 156.0 lb

## 2022-08-05 DIAGNOSIS — Z87442 Personal history of urinary calculi: Secondary | ICD-10-CM | POA: Insufficient documentation

## 2022-08-05 DIAGNOSIS — R109 Unspecified abdominal pain: Secondary | ICD-10-CM

## 2022-08-05 DIAGNOSIS — R319 Hematuria, unspecified: Secondary | ICD-10-CM | POA: Diagnosis not present

## 2022-08-05 DIAGNOSIS — Z87448 Personal history of other diseases of urinary system: Secondary | ICD-10-CM

## 2022-08-05 LAB — URINALYSIS, COMPLETE
Bilirubin, UA: NEGATIVE
Glucose, UA: NEGATIVE
Ketones, UA: NEGATIVE
Nitrite, UA: NEGATIVE
Protein,UA: NEGATIVE
RBC, UA: NEGATIVE
Specific Gravity, UA: 1.015 (ref 1.005–1.030)
Urobilinogen, Ur: 0.2 mg/dL (ref 0.2–1.0)
pH, UA: 7 (ref 5.0–7.5)

## 2022-08-05 LAB — MICROSCOPIC EXAMINATION

## 2022-08-05 NOTE — Progress Notes (Signed)
08/05/2022 10:35 AM   [name] [name] 04-18-1946 [id number]  CC: Chief Complaint  Patient presents with   Hematuria   Hospitalization Follow-up   HPI: [name] [name] is a 76 y.o. female with PMH nephrolithiasis and gross hematuria who presents today for evaluation of hematuria.  She was seen in the emergency department 10 days ago with reports of 6 months of intermittent lightheadedness, dizziness, weakness, nausea, and head pressure.  She did not report any urinary symptoms at that time.  For unclear reasons a urinalysis was ordered which was notable for 11-20 WBCs/hpf, 0-5 RBCs/hpf, 6-10 squamous epithelial cells/LPF, no nitrites, and rare bacteria.  A urine culture was not ordered, but she was discharged with Bactrim.  She followed up outpatient with her PCP 1 week ago, at which point her UA dip was positive for trace blood and 1+ leukocytes.  A urine microscopy was not performed.  Urine culture finalized with mixed urogenital flora.  She was recommended to follow-up with [place] due to the blood on her urine dip.  Today she reports she never had any voiding symptoms at the time of her last 2 medical appointments.  She says that her back has been bothering her over the past 2 days, L>R, but this is not uncommon for her.  She denies dysuria today.  Her dizziness is being worked up with other providers.  Notably, she states that the Bactrim gave her diarrhea but she has previously tolerated Macrobid well.  CT stone study dated 11/06/2020 with a 3 mm nonobstructing left upper pole stone.  Stone was not well visualized on follow-up KUB dated 04/28/2021.  In-office UA today positive for 1+ leukocytes; urine microscopy with 6-10 WBCs/HPF and moderate bacteria.  PMH: Past Medical History:  Diagnosis Date   Allergic genetic state    Arthritis    Cholelithiasis    Depression    Diarrhea    Fibroids    Hearing loss    Hemorrhoids    Hypothyroidism    IBS (irritable bowel syndrome)     Kidney stones    Osteoarthrosis    Plantar fasciitis    Synovitis     Surgical History: Past Surgical History:  Procedure Laterality Date   BREAST CYST ASPIRATION Left 12/14/2019   BUNIONECTOMY     COLONOSCOPY     COLONOSCOPY WITH PROPOFOL N/A 04/10/2018   Procedure: COLONOSCOPY WITH PROPOFOL;  Surgeon: [name], MD;  Location: [facility] ENDOSCOPY;  Service: Endoscopy;  Laterality: N/A;   CYST EXCISION     mouth   DILATION AND CURETTAGE OF UTERUS     EXTRACORPOREAL SHOCK WAVE LITHOTRIPSY Left 03/29/2019   Procedure: EXTRACORPOREAL SHOCK WAVE LITHOTRIPSY (ESWL);  Surgeon: [name], MD;  Location: [facility] ORS;  Service: Urology;  Laterality: Left;   HARDWARE REMOVAL     JOINT REPLACEMENT     TONSILLECTOMY     WISDOM TOOTH EXTRACTION      Home Medications:  Allergies as of 08/05/2022       Reactions   Augmentin [amoxicillin-pot Clavulanate] Nausea And Vomiting   Prednisone Nausea And Vomiting   Codeine    Other reaction(s): Unknown   Erythromycin    Other reaction(s): Unknown   Levofloxacin    Omnicef [cefdinir]         Medication List        Accurate as of August 05, 2022 10:35 AM. If you have any questions, ask your nurse or doctor.  aspirin EC 81 MG tablet Take 81 mg by mouth at bedtime.   Azelastine-Fluticasone 137-50 MCG/ACT Susp Place 1 spray into both nostrils 2 (two) times daily.   cetirizine 10 MG tablet Commonly known as: ZYRTEC Take 1 tablet by mouth daily.   cholecalciferol 25 MCG (1000 UNIT) tablet Commonly known as: VITAMIN D3 Take 1,000 Units by mouth daily.   Coenzyme Q-10 200 MG Caps Take 1 capsule by mouth daily.   EpiPen 2-Pak 0.3 mg/0.3 mL Soaj injection Generic drug: EPINEPHrine Inject 0.3 mg into the muscle as needed.   meclizine 12.5 MG tablet Commonly known as: ANTIVERT Take 1 tablet (12.5 mg total) by mouth 3 (three) times daily as needed for up to 9 doses for dizziness.   NONFORMULARY OR COMPOUNDED  ITEM Inject into the muscle once a week. Compounded allergy shot.   pyridOXINE 25 MG tablet Commonly known as: VITAMIN B6 Take 1 tablet by mouth daily.   Red Yeast Rice Extract 600 MG Caps Take 1 capsule by mouth 2 (two) times daily.   Vitamin B-12 CR 1000 MCG Tbcr Take 1 tablet by mouth daily.   Zinc 100 MG Tabs        Allergies:  Allergies  Allergen Reactions   Augmentin [Amoxicillin-Pot Clavulanate] Nausea And Vomiting   Prednisone Nausea And Vomiting   Codeine     Other reaction(s): Unknown   Erythromycin     Other reaction(s): Unknown   Levofloxacin    Omnicef [Cefdinir]     Family History: Family History  Problem Relation Age of Onset   Breast cancer Maternal Grandmother        Late 29's   Cancer Mother    Kidney failure Father    Breast cancer Sister 80    Social History:   reports that she has never smoked. She has never used smokeless tobacco. She reports current alcohol use of about 1.0 standard drink of alcohol per week. She reports that she does not use drugs.  Physical Exam: BP 138/80   Pulse 86   Ht '5\' 4"'$  (1.626 m)   Wt 156 lb (70.8 kg)   BMI 26.78 kg/m   Constitutional:  Alert and oriented, no acute distress, nontoxic appearing HEENT: [place], AT Cardiovascular: No clubbing, cyanosis, or edema Respiratory: Normal respiratory effort, no increased work of breathing Skin: No rashes, bruises or suspicious lesions Neurologic: Grossly intact, no focal deficits, moving all 4 extremities Psychiatric: Normal mood and affect  Laboratory Data: Results for orders placed or performed in visit on 08/05/22  Microscopic Examination   Urine  Result Value Ref Range   WBC, UA 6-10 (A) 0 - 5 /hpf   RBC, Urine 0-2 0 - 2 /hpf   Epithelial Cells (non renal) 0-10 0 - 10 /hpf   Bacteria, UA Moderate (A) None seen/Few  Urinalysis, Complete  Result Value Ref Range   Specific Gravity, UA 1.015 1.005 - 1.030   pH, UA 7.0 5.0 - 7.5   Color, UA Yellow Yellow    Appearance Ur Hazy (A) Clear   Leukocytes,UA 1+ (A) Negative   Protein,UA Negative Negative/Trace   Glucose, UA Negative Negative   Ketones, UA Negative Negative   RBC, UA Negative Negative   Bilirubin, UA Negative Negative   Urobilinogen, Ur 0.2 0.2 - 1.0 mg/dL   Nitrite, UA Negative Negative   Microscopic Examination See below:    Assessment & Plan:   1. History of hematuria We discussed that UA dip is not definitive for hematuria,  but urine microscopy is.  I reassured her that she did not have hematuria on urine microscopy at the time of her recent ED visit, nor does she have this today.  We additionally discussed that her UA at her recent ED visit appeared contaminated and I do not think she was clinically infected at that time.  She remains asymptomatic and I do not think she is clinically infected today either.  That said, she does have some mild pyuria and bacteriuria on urine microscopy today.  Will send for culture and treat her if she develops symptoms in the interim.  She is in agreement with this plan. - Urinalysis, Complete - CULTURE, URINE COMPREHENSIVE  2. Flank pain with history of urolithiasis Last KUB was greater than 1 year ago.  I offered her a repeat KUB today to evaluate for any interval stone development, especially with reports of worse left flank pain recently and previously identified left upper pole stone.  We will contact her with her results.  She is in agreement with this plan. - DG Abd 1 View; Future   Return for Will call with results.  Debroah Loop, PA-C  [facility] [address] [place], [place] [address] [phone]

## 2022-08-08 LAB — CULTURE, URINE COMPREHENSIVE

## 2022-08-18 NOTE — Progress Notes (Signed)
Referring Physician:  [name], MD [address] [place],  [place] [address]  Primary Physician:  [name], MD  History of Present Illness: 08/19/2022 [name] [name] is here today with a chief complaint of dizziness, balance issues, and vestibular dysfunction.  She feels like her vision has worsened.  She denies headaches.  She has bilateral leg weakness for the last couple of months.  She has some pain in her left hip as well.  She was recently seen in the hospital where a small meningioma was diagnosed.  She presents today to discuss.  Review of Systems:  A 10 point review of systems is negative, except for the pertinent positives and negatives detailed in the HPI.  Past Medical History: Past Medical History:  Diagnosis Date   Allergic genetic state    Arthritis    Cholelithiasis    Depression    Diarrhea    Fibroids    Hearing loss    Hemorrhoids    Hypothyroidism    IBS (irritable bowel syndrome)    Kidney stones    Osteoarthrosis    Plantar fasciitis    Synovitis     Past Surgical History: Past Surgical History:  Procedure Laterality Date   BREAST CYST ASPIRATION Left 12/14/2019   BUNIONECTOMY     COLONOSCOPY     COLONOSCOPY WITH PROPOFOL N/A 04/10/2018   Procedure: COLONOSCOPY WITH PROPOFOL;  Surgeon: [name], MD;  Location: [facility] ENDOSCOPY;  Service: Endoscopy;  Laterality: N/A;   CYST EXCISION     mouth   DILATION AND CURETTAGE OF UTERUS     EXTRACORPOREAL SHOCK WAVE LITHOTRIPSY Left 03/29/2019   Procedure: EXTRACORPOREAL SHOCK WAVE LITHOTRIPSY (ESWL);  Surgeon: [name], MD;  Location: [facility] ORS;  Service: Urology;  Laterality: Left;   HARDWARE REMOVAL     JOINT REPLACEMENT     TONSILLECTOMY     WISDOM TOOTH EXTRACTION      Allergies: Allergies as of 08/19/2022 - Review Complete 08/19/2022  Allergen Reaction Noted   Prednisone Nausea And Vomiting 06/12/2012   Augmentin [amoxicillin-pot clavulanate] Nausea And  Vomiting 02/03/2016   Codeine Other (See Comments) 01/26/2013   Levofloxacin  04/07/2018   Omnicef [cefdinir]  04/07/2018   Erythromycin Diarrhea 08/05/2014    Medications: Current Meds  Medication Sig   fexofenadine (ALLEGRA) 180 MG tablet Take 180 mg by mouth daily.    Social History: Social History   Tobacco Use   Smoking status: Never   Smokeless tobacco: Never  Vaping Use   Vaping Use: Never used  Substance Use Topics   Alcohol use: Yes    Alcohol/week: 1.0 standard drink of alcohol    Types: 1 Glasses of wine per week    Comment: occas   Drug use: Never    Family Medical History: Family History  Problem Relation Age of Onset   Breast cancer Maternal Grandmother        Late 24's   Cancer Mother    Kidney failure Father    Breast cancer Sister 67    Physical Examination: Vitals:   08/19/22 1359  BP: 134/74  Pulse: 77    General: Patient is well developed, well nourished, calm, collected, and in no apparent distress. Attention to examination is appropriate.  Neck:   Supple.  Full range of motion.  Respiratory: Patient is breathing without any difficulty.   NEUROLOGICAL:     Awake, alert, oriented to person, place, and time.  Speech is  clear and fluent. Fund of knowledge is appropriate.   Cranial Nerves: Pupils equal round and reactive to light.  Facial tone is symmetric.  Facial sensation is symmetric. Shoulder shrug is symmetric. Tongue protrusion is midline.  There is no pronator drift.  ROM of spine: full.    Strength: Side Biceps Triceps Deltoid Interossei Grip Wrist Ext. Wrist Flex.  R '5 5 5 5 5 5 5  '$ L '5 5 5 5 5 5 5   '$ Side Iliopsoas Quads Hamstring PF DF EHL  R '5 5 5 5 5 5  '$ L '5 5 5 5 5 5   '$ Reflexes are 2+ and symmetric at the biceps, triceps, brachioradialis, patella and achilles.   Hoffman's is absent.   Bilateral upper and lower extremity sensation is intact to light touch.    No evidence of dysmetria noted.  Gait is normal.      Medical Decision Making  Imaging: MRI Brain 07/26/22 IMPRESSION: 1. No acute intracranial abnormality. 2. Mild cerebral white matter disease, nonspecific, but most likely related to chronic microvascular ischemic disease.     Electronically Signed   By: Jeannine Boga M.D.   On: 07/27/2022 00:39  CTA 07/26/22 IMPRESSION: 1. No evidence of acute intracranial abnormality. 2. No emergent large vessel occlusion or proximal hemodynamically significant stenosis. 3. Probable small (7 mm) meningioma along the high left parietal convexity without significant mass effect or brain edema.     Electronically Signed   By: Margaretha Sheffield M.D.   On: 07/26/2022 20:14  I have personally reviewed the images and agree with the above interpretation.  Assessment and Plan: [name] [name] is a pleasant 76 y.o. 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  There is no old imaging to review, but this is currently not causing any impression on her brain parenchyma.  I think it is very unlikely she will need any intervention on this in the future.  We discussed various options including reimaging in 1 year versus waiting for symptoms to present.  She has opted to wait for symptoms to present.  She will see a rheumatologist in a couple of weeks for evaluation for possible polymyalgia rheumatica.  I have offered neurology referral, but she will talk with rheumatology before she makes a decision regarding that.  I will see her back on an as-needed basis.   I spent a total of 30 minutes in face-to-face and non-face-to-face activities related to this patient's care today.  Thank you for involving me in the care of this patient.      Carollyn Etcheverry K. Izora Ribas MD, [facility] Neurosurgery

## 2022-08-19 ENCOUNTER — Ambulatory Visit: Payer: Medicare PPO | Admitting: Neurosurgery

## 2022-08-19 ENCOUNTER — Encounter: Payer: Self-pay | Admitting: Neurosurgery

## 2022-08-19 VITALS — BP 134/74 | HR 77 | Ht 64.0 in | Wt 155.2 lb

## 2022-08-19 DIAGNOSIS — D329 Benign neoplasm of meninges, unspecified: Secondary | ICD-10-CM

## 2022-09-24 ENCOUNTER — Other Ambulatory Visit: Payer: Self-pay | Admitting: Family Medicine

## 2022-09-24 DIAGNOSIS — G890 Central pain syndrome: Secondary | ICD-10-CM

## 2022-09-27 ENCOUNTER — Ambulatory Visit
Admission: RE | Admit: 2022-09-27 | Discharge: 2022-09-27 | Disposition: A | Discharge status: Home / Self Care | Payer: Medicare PPO | Source: Ambulatory Visit | Attending: Family Medicine | Admitting: Family Medicine

## 2022-09-27 DIAGNOSIS — G890 Central pain syndrome: Secondary | ICD-10-CM | POA: Insufficient documentation

## 2022-10-07 ENCOUNTER — Other Ambulatory Visit: Payer: Self-pay

## 2022-10-07 ENCOUNTER — Ambulatory Visit
Admission: RE | Admit: 2022-10-07 | Discharge: 2022-10-07 | Disposition: A | Discharge status: Home / Self Care | Payer: Self-pay | Source: Ambulatory Visit | Attending: Neurosurgery | Admitting: Neurosurgery

## 2022-10-07 DIAGNOSIS — Z049 Encounter for examination and observation for unspecified reason: Secondary | ICD-10-CM

## 2022-10-08 ENCOUNTER — Ambulatory Visit
Admission: RE | Admit: 2022-10-08 | Discharge: 2022-10-08 | Disposition: A | Discharge status: Home / Self Care | Payer: Self-pay | Source: Ambulatory Visit | Attending: Neurosurgery | Admitting: Neurosurgery

## 2022-10-08 DIAGNOSIS — Z049 Encounter for examination and observation for unspecified reason: Secondary | ICD-10-CM

## 2022-10-11 NOTE — Progress Notes (Unsigned)
Referring Physician:  Harvest Dark, [place],  [place] [address]  Primary Physician:  [name], MD  History of Present Illness: 10/12/2022 [name] is here today with a chief complaint of some low back pain and weakness and heaviness in the bilateral legs. She denies any numbness in the legs.  She mostly has difficulty right when she stands up.  She is able to get through most of her daily activities without trouble at this time.  Her legs feel weak and heavy, but she does not have clearly radicular pain.  She does use a grocery cart religiously in the store.  She has not had to alter her daily activities too much at this point.  She does not have any upper extremity symptoms.  I saw her previously for a small meningioma.  She has been treated for vestibular dysfunction and is currently undergoing physical therapy for this.  Bowel/Bladder Dysfunction: none  Conservative measures:  Physical therapy: has participated in some for her balance at [facility] but not for her back.  Multimodal medical therapy including regular antiinflammatories: tylenol, aleve  Injections: has not received any epidural steroid injections  Past Surgery: denies  [name] [name] has no symptoms of cervical myelopathy.  The symptoms are causing a significant impact on the patient's life.   I have utilized the care everywhere function in epic to review the outside records available from external health systems.  Review of Systems:  A 10 point review of systems is negative, except for the pertinent positives and negatives detailed in the HPI.  Past Medical History: Past Medical History:  Diagnosis Date   Allergic genetic state    Arthritis    Cholelithiasis    Depression    Diarrhea    Fibroids    Hearing loss    Hemorrhoids    Hypothyroidism    IBS (irritable bowel syndrome)    Kidney stones    Osteoarthrosis    Plantar fasciitis    Synovitis     Past  Surgical History: Past Surgical History:  Procedure Laterality Date   BREAST CYST ASPIRATION Left 12/14/2019   BUNIONECTOMY     COLONOSCOPY     COLONOSCOPY WITH PROPOFOL N/A 04/10/2018   Procedure: COLONOSCOPY WITH PROPOFOL;  Surgeon: [name], MD;  Location: [facility] ENDOSCOPY;  Service: Endoscopy;  Laterality: N/A;   CYST EXCISION     mouth   DILATION AND CURETTAGE OF UTERUS     EXTRACORPOREAL SHOCK WAVE LITHOTRIPSY Left 03/29/2019   Procedure: EXTRACORPOREAL SHOCK WAVE LITHOTRIPSY (ESWL);  Surgeon: [name], MD;  Location: [facility] ORS;  Service: Urology;  Laterality: Left;   HARDWARE REMOVAL     JOINT REPLACEMENT     TONSILLECTOMY     WISDOM TOOTH EXTRACTION      Allergies: Allergies as of 10/12/2022 - Review Complete 08/19/2022  Allergen Reaction Noted   Prednisone Nausea And Vomiting 06/12/2012   Augmentin [amoxicillin-pot clavulanate] Nausea And Vomiting 02/03/2016   Codeine Other (See Comments) 01/26/2013   Levofloxacin  04/07/2018   Omnicef [cefdinir]  04/07/2018   Erythromycin Diarrhea 08/05/2014    Medications: Current Meds  Medication Sig   aspirin 81 MG EC tablet Take 81 mg by mouth at bedtime.    Azelastine-Fluticasone 137-50 MCG/ACT SUSP Place 1 spray into both nostrils 2 (two) times daily.   Calcium Carbonate-Vit D-Min (CALCIUM 600+D PLUS MINERALS) 600-400 MG-UNIT TABS Take 1 tablet by mouth daily.   cholecalciferol (  VITAMIN D) 1000 units tablet Take 1,000 Units by mouth daily.   Coenzyme Q-10 200 MG CAPS Take 1 capsule by mouth daily.   Cyanocobalamin (VITAMIN B-12 CR) 1000 MCG TBCR Take 1 tablet by mouth daily.   EPIPEN 2-PAK 0.3 MG/0.3ML SOAJ injection Inject 0.3 mg into the muscle as needed.   fexofenadine (ALLEGRA) 180 MG tablet Take 180 mg by mouth daily.   meclizine (ANTIVERT) 12.5 MG tablet Take 1 tablet (12.5 mg total) by mouth 3 (three) times daily as needed for up to 9 doses for dizziness.   NONFORMULARY OR COMPOUNDED ITEM Inject into the  muscle once a week. Compounded allergy shot.   pyridOXINE (VITAMIN B-6) 25 MG tablet Take 1 tablet by mouth daily.   Red Yeast Rice Extract 600 MG CAPS Take 1 capsule by mouth 2 (two) times daily.   Zinc 100 MG TABS    zoledronic acid (RECLAST) 5 MG/100ML SOLN injection Inject into the vein. Every 12 months    Social History: Social History   Tobacco Use   Smoking status: Never   Smokeless tobacco: Never  Vaping Use   Vaping Use: Never used  Substance Use Topics   Alcohol use: Yes    Alcohol/week: 1.0 standard drink of alcohol    Types: 1 Glasses of wine per week    Comment: occas   Drug use: Never    Family Medical History: Family History  Problem Relation Age of Onset   Breast cancer Maternal Grandmother        Late 80's   Cancer Mother    Kidney failure Father    Breast cancer Sister 65    Physical Examination: Vitals:   10/12/22 0829  BP: 134/74    General: Patient is well developed, well nourished, calm, collected, and in no apparent distress. Attention to examination is appropriate.  Neck:   Supple.  Full range of motion.  Respiratory: Patient is breathing without any difficulty.   NEUROLOGICAL:     Awake, alert, oriented to person, place, and time.  Speech is clear and fluent. Fund of knowledge is appropriate.   Cranial Nerves: Pupils equal round and reactive to light.  Facial tone is symmetric.  Facial sensation is symmetric. Shoulder shrug is symmetric. Tongue protrusion is midline.  There is no pronator drift.  ROM of spine: full.    Strength: Side Biceps Triceps Deltoid Interossei Grip Wrist Ext. Wrist Flex.  R '5 5 5 5 5 5 5  '$ L '5 5 5 5 5 5 5   '$ Side Iliopsoas Quads Hamstring PF DF EHL  R '5 5 5 5 5 5  '$ L '5 5 5 5 5 5   '$ Reflexes are 1+ and symmetric at the biceps, triceps, brachioradialis, patella and achilles.   Hoffman's is absent.   Bilateral upper and lower extremity sensation is intact to light touch.    No evidence of dysmetria  noted.  Gait is abnormal and wide the base.  She has mild to moderate difficulty with tandem gait..     Medical Decision Making  Imaging: MRI L spine 09/27/2022   IMPRESSION: 1. Advanced and generalized lumbar spine degeneration with mild scoliosis and L4-5 anterolisthesis. 2. Advanced/compressive spinal stenosis at L3-4 and especially at L4-5. 3. Foraminal impingement on the left at L3-4 and especially at L4-5. 4. Asymmetric right subarticular recess narrowing at L1-2, L2-3, and L5-S1.     Electronically Signed   By: Jorje Guild M.D.   On: 09/27/2022 18:57  MRI  C spine 09/27/2022 IMPRESSION: 1. Advanced and generalized cervical spine degeneration with mild scoliosis and C3-4, T2-3 anterolisthesis. 2. Particularly advanced facet and uncovertebral spurring on the left at C3-4 and C4-5 with foraminal impingement. 3. On the right side there is foraminal impingement at C6-7 and especially C5-6. 4. Spinal stenosis with mild cord flattening at C6-7.     Electronically Signed   By: Jorje Guild M.D.   On: 09/27/2022 18:52    I have personally reviewed the images and agree with the above interpretation.  Assessment and Plan: Ms. Schnepp is a pleasant 76 y.o. 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  I think she has some neurogenic claudication though it is not severe at this time.  She has no overt symptoms of cervical myelopathy.  She has a nonoperative small meningioma in the left parietal lobe.  At this point, her lumbar stenosis is not limiting her life.  I recommended starting physical therapy.  She does not have enough pain to consider injections at this point.  I will see her back in 2 months to monitor her progress.  We did discuss warning signs that would prompt early return for cervical myelopathy including loss of dexterity in her hands, loss of grip strength in her hands, or worsening balance.  I do not think it is likely that will happen soon.   I spent a  total of 30 minutes in this patient's care today. This time was spent reviewing pertinent records including imaging studies, obtaining and confirming history, performing a directed evaluation, formulating and discussing my recommendations, and documenting the visit within the medical record.   Thank you for involving me in the care of this patient.      Vlad Mayberry K. Izora Ribas MD, [facility] Neurosurgery

## 2022-10-12 ENCOUNTER — Encounter: Payer: Self-pay | Admitting: Neurosurgery

## 2022-10-12 ENCOUNTER — Ambulatory Visit: Payer: Medicare PPO | Admitting: Neurosurgery

## 2022-10-12 VITALS — BP 134/74 | Ht 64.0 in | Wt 155.4 lb

## 2022-10-12 DIAGNOSIS — D329 Benign neoplasm of meninges, unspecified: Secondary | ICD-10-CM | POA: Diagnosis not present

## 2022-10-12 DIAGNOSIS — M48062 Spinal stenosis, lumbar region with neurogenic claudication: Secondary | ICD-10-CM

## 2022-10-12 DIAGNOSIS — M4802 Spinal stenosis, cervical region: Secondary | ICD-10-CM

## 2022-10-18 ENCOUNTER — Emergency Department
Admission: EM | Admit: 2022-10-18 | Discharge: 2022-10-18 | Disposition: A | Discharge status: Home / Self Care | Payer: Medicare PPO | Attending: Emergency Medicine | Admitting: Emergency Medicine

## 2022-10-18 ENCOUNTER — Other Ambulatory Visit: Payer: Self-pay

## 2022-10-18 ENCOUNTER — Emergency Department: Payer: Medicare PPO

## 2022-10-18 DIAGNOSIS — R002 Palpitations: Secondary | ICD-10-CM

## 2022-10-18 LAB — CBC
HCT: 48.6 % — ABNORMAL HIGH (ref 36.0–46.0)
Hemoglobin: 15.7 g/dL — ABNORMAL HIGH (ref 12.0–15.0)
MCH: 30.8 pg (ref 26.0–34.0)
MCHC: 32.3 g/dL (ref 30.0–36.0)
MCV: 95.3 fL (ref 80.0–100.0)
Platelets: 328 10*3/uL (ref 150–400)
RBC: 5.1 MIL/uL (ref 3.87–5.11)
RDW: 13.7 % (ref 11.5–15.5)
WBC: 7.3 10*3/uL (ref 4.0–10.5)
nRBC: 0 % (ref 0.0–0.2)

## 2022-10-18 LAB — COMPREHENSIVE METABOLIC PANEL
ALT: 22 U/L (ref 0–44)
AST: 32 U/L (ref 15–41)
Albumin: 4.4 g/dL (ref 3.5–5.0)
Alkaline Phosphatase: 54 U/L (ref 38–126)
Anion gap: 8 (ref 5–15)
BUN: 18 mg/dL (ref 8–23)
CO2: 26 mmol/L (ref 22–32)
Calcium: 9 mg/dL (ref 8.9–10.3)
Chloride: 107 mmol/L (ref 98–111)
Creatinine, Ser: 0.68 mg/dL (ref 0.44–1.00)
GFR, Estimated: >60 mL/min (ref >60)
Glucose, Bld: 132 mg/dL — ABNORMAL HIGH (ref 70–99)
Potassium: 4.2 mmol/L (ref 3.5–5.1)
Sodium: 141 mmol/L (ref 135–145)
Total Bilirubin: 0.8 mg/dL (ref 0.3–1.2)
Total Protein: 7.7 g/dL (ref 6.5–8.1)

## 2022-10-18 LAB — TROPONIN I (HIGH SENSITIVITY)
Troponin I (High Sensitivity): 36 ng/L — ABNORMAL HIGH (ref <18)
Troponin I (High Sensitivity): 45 ng/L — ABNORMAL HIGH (ref <18)

## 2022-10-18 NOTE — ED Provider Notes (Signed)
[facility] Provider Note    Event Date/Time   First MD Initiated Contact with Patient 10/18/22 1032     (approximate)   History   Palpitations   HPI  [name] [name] is a 76 y.o. female  who presents to the emergency department today because of concern for palpitations.  She says that it woke her up last night. The patient felt her heart was racing. She put on her smart watch which said she was in afib and heart rate was going over 100. At the time of my exam the patient's symptoms had improved. They lasted about 5 hours.       Physical Exam   Triage Vital Signs: ED Triage Vitals  Enc Vitals Group     BP 10/18/22 0903 122/74     Pulse Rate 10/18/22 0901 78     Resp 10/18/22 0901 18     Temp 10/18/22 0901 98.3 F (36.8 C)     Temp Source 10/18/22 0901 Oral     SpO2 10/18/22 0901 98 %     Weight 10/18/22 0901 156 lb (70.8 kg)     Height 10/18/22 0901 '5\' 4"'$  (1.626 m)     Head Circumference --      Peak Flow --      Pain Score 10/18/22 0901 0     Pain Loc --      Pain Edu? --      Excl. in [place]? --     Most recent vital signs: Vitals:   10/18/22 0901 10/18/22 0903  BP:  122/74  Pulse: 78   Resp: 18   Temp: 98.3 F (36.8 C)   SpO2: 98%     General: Awake, alert, oriented. CV:  Good peripheral perfusion. Regular rate and rhythm. Resp:  Normal effort. Lungs clear. Abd:  No distention.     ED Results / Procedures / Treatments   Labs (all labs ordered are listed, but only abnormal results are displayed) Labs Reviewed  CBC - Abnormal; Notable for the following components:      Result Value   Hemoglobin 15.7 (*)    HCT 48.6 (*)    All other components within normal limits  COMPREHENSIVE METABOLIC PANEL - Abnormal; Notable for the following components:   Glucose, Bld 132 (*)    All other components within normal limits  TROPONIN I (HIGH SENSITIVITY) - Abnormal; Notable for the following components:   Troponin I (High Sensitivity)  36 (*)    All other components within normal limits  TROPONIN I (HIGH SENSITIVITY) - Abnormal; Notable for the following components:   Troponin I (High Sensitivity) 45 (*)    All other components within normal limits     EKG  I, Nance Pear, attending physician, personally viewed and interpreted this EKG  EKG Time: 0858 Rate: 79 Rhythm: normal sinus rhythm Axis: normal Intervals: qtc 401 QRS: narrow ST changes: no st elevation Impression: normal ekg   RADIOLOGY I independently interpreted and visualized the CXR. My interpretation: No pneumonia Radiology interpretation:  IMPRESSION:  No acute cardiopulmonary process.      PROCEDURES:  Critical Care performed: No  Procedures   MEDICATIONS ORDERED IN ED: Medications - No data to display   IMPRESSION / MDM / [place] / ED COURSE  I reviewed the triage vital signs and the nursing notes.  Differential diagnosis includes, but is not limited to, atrial fibrillation, PVCs, sinus tachycardia, SVT.  Patient's presentation is most consistent with acute presentation with potential threat to life or bodily function.  Patient presented to the emergency department today because of concerns for palpitations.  Patient smart phone did state that she was in atrial fibrillation.  Patient symptoms had resolved upon presentation to the emergency department.  EKG here shows a normal sinus rhythm.  Blood work without concerning electrolyte abnormalities.  At this time unclear if patient did have A-fib with RVR, could also been SVT or sinus tach with premature beats.  Did have a discussion with the patient.  At this time given unclear diagnosis I would be hesitant to start patient on blood thinners.  She does take baby aspirin every day and I encouraged her to continue that.  She has follow-up appointment with her primary care already scheduled for this upcoming week.  Did discuss and encourage  patient to return to the emergency department if she was to feel similar symptoms again so we could try to capture the rhythm on an EKG.   FINAL CLINICAL IMPRESSION(S) / ED DIAGNOSES   Final diagnoses:  Palpitations     Note:  This document was prepared using Dragon voice recognition software and may include unintentional dictation errors.    Nance Pear, MD 10/18/22 [phone]

## 2022-10-18 NOTE — ED Triage Notes (Signed)
Pt states this am she felt palpitations and apple watch told her she might be in Afib. Pt has no history of the same, states symptoms lasted about 5 hrs. Pt states no symptoms at this time.

## 2022-10-18 NOTE — Discharge Instructions (Signed)
Please seek medical attention for any high fevers, chest pain, shortness of breath, change in behavior, persistent vomiting, bloody stool or any other new or concerning symptoms.  

## 2022-10-23 ENCOUNTER — Telehealth: Payer: Self-pay | Admitting: Neurosurgery

## 2022-10-23 NOTE — Telephone Encounter (Signed)
Was contacted by the patient via the answering service She had reached out because she felt subjectively that she was having slightly worsening difficulty with walking in both of her legs but particularly in her left leg which she noted was slightly more tingling than it normally had been.  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  Today she notes that she was a little more active on her feet and now toward the end of the day is experiencing slightly worse subjective feelings of weakness in bilateral legs and particularly with left leg tingling sensation.  She denies any bowel or bladder loss function she denies any radicular pain or worsening low back pain.  She is able to ambulate otherwise.  I discussed with her that this is likely an exacerbation of her underlying lumbar stenosis in the setting of her more active physical activity today.  She was going to try heating pad to see if this improved her symptoms and otherwise was going to get some rest this afternoon.  She has upcoming physical therapy already scheduled for the beginning part of the new year.  I told her that I would pass this contact on to the weekday coverage for the practice so that they could reach out and check in to see how her legs were doing over the next several days or there may be some exacerbation of her already fairly tight lumbar stenosis.  She knows to reach out to [place] in the meantime should any new or concerning symptoms arise or any worsening symptoms occur.  She was satisfied with this plan and all questions were addressed.  Luciano Cutter. Johnney Killian, M.D. Neurosurgery

## 2022-10-25 NOTE — Telephone Encounter (Signed)
Patient calling back. She will be leaving tomorrow out of town and will not be back until January. She would like Dr.Yarbrough's opinion. No pain only discomfort in her legs and feet when she is standing. Her legs feel heavy and weak only while standing. She has discomfort in her lower back when sitting. What worries her the most is dizziness and she feels unbalanced. Could this be the stenosis or her inner ear issue? She will start PT when she gets back in town. [phone]

## 2022-10-25 NOTE — Telephone Encounter (Signed)
Her appt was moved to 12/09/22 and added to the wait list.

## 2022-11-18 ENCOUNTER — Telehealth: Payer: Self-pay

## 2022-11-18 NOTE — Telephone Encounter (Signed)
-----   Message from Peggyann Shoals sent at 11/18/2022 10:48 AM EST ----- Regarding: FYI only Contact: [phone] She went to [place] for the holidays and had a stoke. She is not sure how long she will be up there. She was supposed to start PT with [facility] and see you on 12/09/22. That appt has been rescheduled to 01/07/2023. Hopefully by then she will be back home and have completed PT.

## 2022-11-18 NOTE — Telephone Encounter (Signed)
Noted  

## 2022-12-09 ENCOUNTER — Ambulatory Visit: Payer: Medicare PPO | Admitting: Neurosurgery

## 2022-12-23 ENCOUNTER — Ambulatory Visit: Payer: Medicare PPO | Admitting: Neurosurgery

## 2022-12-26 ENCOUNTER — Encounter: Payer: Self-pay | Admitting: Emergency Medicine

## 2022-12-26 ENCOUNTER — Emergency Department
Admission: EM | Admit: 2022-12-26 | Discharge: 2022-12-26 | Disposition: A | Discharge status: Home / Self Care | Payer: Medicare PPO | Attending: Emergency Medicine | Admitting: Emergency Medicine

## 2022-12-26 ENCOUNTER — Other Ambulatory Visit: Payer: Self-pay

## 2022-12-26 DIAGNOSIS — R040 Epistaxis: Secondary | ICD-10-CM | POA: Insufficient documentation

## 2022-12-26 HISTORY — DX: Cerebral infarction, unspecified: I63.9

## 2022-12-26 MED ORDER — OXYMETAZOLINE HCL 0.05 % NA SOLN: 2.0000 | NASAL | 0 refills | Status: DC | PRN

## 2022-12-26 NOTE — ED Notes (Signed)
E-signature pad not functional.

## 2022-12-26 NOTE — ED Notes (Addendum)
Patient reports nasal bleeding has stopped. Patient denies any discomfort. Skin color is WNL.

## 2022-12-26 NOTE — ED Provider Notes (Signed)
[facility] Provider Note  Patient Contact: 6:34 PM (approximate)   History   Epistaxis   HPI  [name] [name] is a 77 y.o. female who presents to the emergency department complaining of a nosebleed.  Patient states that she was recently started on Plavix secondary to ischemic stroke.  She states that she has significant allergies, gets allergy shots for same.  States that she has not been able to get her allergy shots over the last 6 weeks.  She has had increased congestion and sneezing.  States in the last month she has had 3 periods of epistaxis.  Typically it is a small trickle of blood, easily controlled direct pressure.  Tonight she had difficulty controlling the bleeding and presented to the ED.  She states by the time she was and route to the hospital she was able to control the bleeding and has had no resumption of bleeding since arrival.  No trauma to the nose.  Patient denies any atypical bruising, petechial rash.     Physical Exam   Triage Vital Signs: ED Triage Vitals  Enc Vitals Group     BP 12/26/22 1603 (!) 148/74     Pulse Rate 12/26/22 1603 74     Resp 12/26/22 1603 18     Temp 12/26/22 1603 97.7 F (36.5 C)     Temp Source 12/26/22 1603 Oral     SpO2 12/26/22 1603 98 %     Weight 12/26/22 1602 154 lb 5.2 oz (70 kg)     Height 12/26/22 1602 5' 4"$  (1.626 m)     Head Circumference --      Peak Flow --      Pain Score 12/26/22 1603 0     Pain Loc --      Pain Edu? --      Excl. in [place]? --     Most recent vital signs: Vitals:   12/26/22 1603  BP: (!) 148/74  Pulse: 74  Resp: 18  Temp: 97.7 F (36.5 C)  SpO2: 98%     General: Alert and in no acute distress ENT:      Ears:       Nose: No congestion/rhinnorhea.  Slight scabbing noted over the left distal septum.  No active bleeding.      Mouth/Throat: Mucous membranes are moist  Cardiovascular:  Good peripheral perfusion Respiratory: Normal respiratory effort without  tachypnea or retractions. Lungs CTAB.  Musculoskeletal: Full range of motion to all extremities.  Neurologic:  No gross focal neurologic deficits are appreciated.  Skin:   No rash noted Other:   ED Results / Procedures / Treatments   Labs (all labs ordered are listed, but only abnormal results are displayed) Labs Reviewed - No data to display   EKG     RADIOLOGY    No results found.  PROCEDURES:  Critical Care performed: No  Procedures   MEDICATIONS ORDERED IN ED: Medications - No data to display   IMPRESSION / MDM / [place] / ED COURSE  I reviewed the triage vital signs and the nursing notes.                                 Differential diagnosis includes, but is not limited to, epistaxis, thrombocytopenia, nasal trauma   Patient's presentation is most consistent with acute presentation with potential threat to life or bodily function.   Patient's  diagnosis is consistent with epistaxis.  Patient presents emergency department with epistaxis out of the left nares.  Scabbing appreciated over the anterior nose.  No ongoing bleeding.  Patient has a history of significant allergies, is taking allergy shots for same.  Patient also started Plavix recently due to a stroke.  Suspect bleeding component was secondary to the Plavix.  I recommended labs to evaluate her INR as well as platelets.  She states that she would prefer not to have labs drawn as she just had these labs drawn 2 days ago at her primary care office with normal results.  At this time I feel this is reasonable as she states that she has close follow-up and can talk to primary care tomorrow.  I will prescribe Afrin with a nasal clamp for any resumption of bleeding.  Concerning signs and symptoms regarding difficulty controlling bleeding, other signs and symptoms concerning for increased INR/thrombocytopenia that may result in spontaneous bleeding are discussed with the patient.  She will follow-up  with primary care for routine labs.. Patient is given ED precautions to return to the ED for any worsening or new symptoms.     FINAL CLINICAL IMPRESSION(S) / ED DIAGNOSES   Final diagnoses:  Anterior epistaxis     Rx / DC Orders   ED Discharge Orders          Ordered    oxymetazoline (AFRIN) 0.05 % nasal spray  As needed        12/26/22 1857             Note:  This document was prepared using Dragon voice recognition software and may include unintentional dictation errors.   Brynda Peon 12/26/22 1857    Blake Divine, MD 12/26/22 [phone]

## 2022-12-26 NOTE — ED Triage Notes (Signed)
Pt recently placed on Plavix due to stroke. Pt reports nose bleed that she can't control. No bleeding at this time. Pt states it feels like she needs to blow her nose and then it will bleed again.

## 2022-12-29 ENCOUNTER — Telehealth: Payer: Self-pay

## 2022-12-29 NOTE — Telephone Encounter (Signed)
Noted. Glad she is back and hopefully doing better.

## 2022-12-29 NOTE — Telephone Encounter (Signed)
-----   Message from [place] sent at 12/29/2022  3:10 PM EST ----- Regarding: Rescheduled appt wanted noted for Dr. Darreld Mclean [name] [name] was supposed to be seen by Dr. Darreld Mclean 03/05 recheck after PT. 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 Referral was sent to [facility] and she states that she did make them aware of the situation. She is back in [place] now and will contact [facility] to start PT in March.  Appt now scheduled for 04/23

## 2023-01-05 ENCOUNTER — Other Ambulatory Visit: Payer: Self-pay | Admitting: Family Medicine

## 2023-01-05 DIAGNOSIS — Z1231 Encounter for screening mammogram for malignant neoplasm of breast: Secondary | ICD-10-CM

## 2023-01-11 ENCOUNTER — Ambulatory Visit: Payer: Medicare PPO | Admitting: Neurosurgery

## 2023-02-07 ENCOUNTER — Ambulatory Visit
Admission: RE | Admit: 2023-02-07 | Discharge: 2023-02-07 | Disposition: A | Discharge status: Home / Self Care | Payer: Medicare PPO | Source: Ambulatory Visit | Attending: Family Medicine | Admitting: Family Medicine

## 2023-02-07 DIAGNOSIS — Z1231 Encounter for screening mammogram for malignant neoplasm of breast: Secondary | ICD-10-CM | POA: Insufficient documentation

## 2023-03-01 ENCOUNTER — Ambulatory Visit: Payer: Medicare PPO | Admitting: Neurosurgery

## 2023-03-01 ENCOUNTER — Encounter: Payer: Self-pay | Admitting: Neurosurgery

## 2023-03-01 VITALS — BP 134/76 | Ht 64.0 in | Wt 159.8 lb

## 2023-03-01 DIAGNOSIS — M48062 Spinal stenosis, lumbar region with neurogenic claudication: Secondary | ICD-10-CM | POA: Diagnosis not present

## 2023-03-01 DIAGNOSIS — I639 Cerebral infarction, unspecified: Secondary | ICD-10-CM | POA: Diagnosis not present

## 2023-03-01 DIAGNOSIS — M4802 Spinal stenosis, cervical region: Secondary | ICD-10-CM

## 2023-03-01 NOTE — Progress Notes (Signed)
Referring Physician:  Rayetta Humphrey, MD [address] [place],  [place] [address]  Primary Physician:  Rayetta Humphrey, MD  History of Present Illness: 03/01/2023 She unfortunately suffered a stroke on New Year's Day.  She has made great improvements and is working with physical therapy.  She is not having significant symptoms from her lower back disease currently.  She does have some discomfort in her lower left leg on the lateral aspect when she stands or walks for too long.  10/12/2022 [name] [name] is here today with a chief complaint of some low back pain and weakness and heaviness in the bilateral legs. She denies any numbness in the legs.  She mostly has difficulty right when she stands up.  She is able to get through most of her daily activities without trouble at this time.  Her legs feel weak and heavy, but she does not have clearly radicular pain.  She does use a grocery cart religiously in the store.  She has not had to alter her daily activities too much at this point.  She does not have any upper extremity symptoms.  I saw her previously for a small meningioma.  She has been treated for vestibular dysfunction and is currently undergoing physical therapy for this.  Bowel/Bladder Dysfunction: none  Conservative measures:  Physical therapy: has participated in some for her balance at [facility] but not for her back.  Multimodal medical therapy including regular antiinflammatories: tylenol, aleve  Injections: has not received any epidural steroid injections  Past Surgery: denies  [name] [name] has no symptoms of cervical myelopathy.  The symptoms are causing a significant impact on the patient's life.   I have utilized the care everywhere function in epic to review the outside records available from external health systems.  Review of Systems:  A 10 point review of systems is negative, except for the pertinent positives and negatives detailed in the HPI.  Past  Medical History: Past Medical History:  Diagnosis Date   Allergic genetic state    Arthritis    Cholelithiasis    Depression    Diarrhea    Fibroids    Hearing loss    Hemorrhoids    Hypothyroidism    IBS (irritable bowel syndrome)    Kidney stones    Osteoarthrosis    Plantar fasciitis    Seizure    Stroke    Synovitis    TIA (transient ischemic attack)     Past Surgical History: Past Surgical History:  Procedure Laterality Date   BREAST CYST ASPIRATION Left 12/14/2019   BUNIONECTOMY     COLONOSCOPY     COLONOSCOPY WITH PROPOFOL N/A 04/10/2018   Procedure: COLONOSCOPY WITH PROPOFOL;  Surgeon: Scot Jun, MD;  Location: [facility] ENDOSCOPY;  Service: Endoscopy;  Laterality: N/A;   CYST EXCISION     mouth   DILATION AND CURETTAGE OF UTERUS     EXTRACORPOREAL SHOCK WAVE LITHOTRIPSY Left 03/29/2019   Procedure: EXTRACORPOREAL SHOCK WAVE LITHOTRIPSY (ESWL);  Surgeon: Sondra Come, MD;  Location: [facility] ORS;  Service: Urology;  Laterality: Left;   HARDWARE REMOVAL     JOINT REPLACEMENT     TONSILLECTOMY     WISDOM TOOTH EXTRACTION      Allergies: Allergies as of 03/01/2023 - Review Complete 03/01/2023  Allergen Reaction Noted   Prednisone Nausea And Vomiting 06/12/2012   Augmentin [amoxicillin-pot clavulanate] Nausea And Vomiting 02/03/2016   Codeine Other (See Comments) 01/26/2013   Levofloxacin  04/07/2018   Omnicef [cefdinir]  04/07/2018   Trimethoprim Other (See Comments) 11/08/2022   Erythromycin Diarrhea 08/05/2014   Sulfamethoxazole-trimethoprim Diarrhea 10/28/2022    Medications: Current Meds  Medication Sig   aspirin 81 MG EC tablet Take 81 mg by mouth at bedtime.    atorvastatin (LIPITOR) 40 MG tablet Take 40 mg by mouth daily.   fexofenadine (ALLEGRA) 180 MG tablet Take 180 mg by mouth daily.   meclizine (ANTIVERT) 12.5 MG tablet Take 1 tablet (12.5 mg total) by mouth 3 (three) times daily as needed for up to 9 doses for dizziness.    Social  History: Social History   Tobacco Use   Smoking status: Never   Smokeless tobacco: Never  Vaping Use   Vaping Use: Never used  Substance Use Topics   Alcohol use: Yes    Alcohol/week: 1.0 standard drink of alcohol    Types: 1 Glasses of wine per week    Comment: occas   Drug use: Never    Family Medical History: Family History  Problem Relation Age of Onset   Breast cancer Maternal Grandmother        Late 78's   Cancer Mother    Kidney failure Father    Breast cancer Sister 21    Physical Examination: Vitals:   03/01/23 1128  BP: 134/76    General: Patient is well developed, well nourished, calm, collected, and in no apparent distress. Attention to examination is appropriate.  Neck:   Supple.  Full range of motion.  Respiratory: Patient is breathing without any difficulty.   NEUROLOGICAL:     Awake, alert, oriented to person, place, and time.  Speech is clear and fluent. Fund of knowledge is appropriate.   Cranial Nerves: Pupils equal round and reactive to light.  Facial tone is symmetric.  Facial sensation is symmetric. Shoulder shrug is symmetric. Tongue protrusion is midline.  There is no pronator drift.  ROM of spine: full.    Strength: Side Biceps Triceps Deltoid Interossei Grip Wrist Ext. Wrist Flex.  R 5 5 5 5 5 5 5   L 5 5 5 5 5 5 5    Side Iliopsoas Quads Hamstring PF DF EHL  R 5 5 5 5 5 5   L 5 5 5 5 5 5    Reflexes are 1+ and symmetric at the biceps, triceps, brachioradialis, patella and achilles.   Hoffman's is absent.   Bilateral upper and lower extremity sensation is intact to light touch.    No evidence of dysmetria noted.  Gait is abnormal and wide the base.  She has mild to moderate difficulty with tandem gait..     Medical Decision Making  Imaging: MRI L spine 09/27/2022   IMPRESSION: 1. Advanced and generalized lumbar spine degeneration with mild scoliosis and L4-5 anterolisthesis. 2. Advanced/compressive spinal stenosis at L3-4  and especially at L4-5. 3. Foraminal impingement on the left at L3-4 and especially at L4-5. 4. Asymmetric right subarticular recess narrowing at L1-2, L2-3, and L5-S1.     Electronically Signed   By: Tiburcio Pea M.D.   On: 09/27/2022 18:57  MRI C spine 09/27/2022 IMPRESSION: 1. Advanced and generalized cervical spine degeneration with mild scoliosis and C3-4, T2-3 anterolisthesis. 2. Particularly advanced facet and uncovertebral spurring on the left at C3-4 and C4-5 with foraminal impingement. 3. On the right side there is foraminal impingement at C6-7 and especially C5-6. 4. Spinal stenosis with mild cord flattening at C6-7.     Electronically Signed  By: Tiburcio Pea M.D.   On: 09/27/2022 18:52    I have personally reviewed the images and agree with the above interpretation.  Assessment and Plan: [name] [name] is a pleasant 77 y.o. 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  She is recovering from a stroke.  She is making great progress.  She has seen significant improvements with physical therapy.  I will send a new referral for physical therapy so she can continue.  If her symptoms worsen, we will reevaluate and consider whether any intervention is necessary.  At this point, I think she should concentrate on nonoperative means.     I spent a total of 10 minutes in this patient's care today. This time was spent reviewing pertinent records including imaging studies, obtaining and confirming history, performing a directed evaluation, formulating and discussing my recommendations, and documenting the visit within the medical record.   Thank you for involving me in the care of this patient.      Arnesha Schiraldi K. Myer Haff MD, [facility] Neurosurgery

## 2023-04-17 NOTE — H&P (Signed)
Pre-Procedure H&P   Patient ID: [name] [name] is a 77 y.o. female.  Gastroenterology Provider: Jaynie Collins, DO  Referring Provider: Wandra Mannan, NP PCP: Rayetta Humphrey, MD  Date: 04/17/2023  HPI [name] is a 77 y.o. female who presents today for Colonoscopy for FIT positive.  Patient has noted some bright red blood per rectum with straining and constipation. 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  She is no longer on Plavix.  Underwent FIT testing and was positive in April.  Longstanding history of IBS-D.  Maternal uncle with colorectal cancer  Last colonoscopy June 2019 with internal hemorrhoids.  Negative colonoscopy in 2009 as well.  Patient has not noted any melena hematochezia diarrhea appetite or weight changes. Hemoglobin 13.4 MCV 97 platelets 278,000 creatinine 0.8   Past Medical History:  Diagnosis Date   Allergic genetic state    Arthritis    Cholelithiasis    Depression    Diarrhea    Fibroids    Hearing loss    Hemorrhoids    Hypothyroidism    IBS (irritable bowel syndrome)    Kidney stones    Osteoarthrosis    Plantar fasciitis    Seizure (HCC)    Stroke (HCC)    Synovitis    TIA (transient ischemic attack)     Past Surgical History:  Procedure Laterality Date   BREAST CYST ASPIRATION Left 12/14/2019   BUNIONECTOMY     COLONOSCOPY     COLONOSCOPY WITH PROPOFOL N/A 04/10/2018   Procedure: COLONOSCOPY WITH PROPOFOL;  Surgeon: Scot Jun, MD;  Location: [facility] ENDOSCOPY;  Service: Endoscopy;  Laterality: N/A;   CYST EXCISION     mouth   DILATION AND CURETTAGE OF UTERUS     EXTRACORPOREAL SHOCK WAVE LITHOTRIPSY Left 03/29/2019   Procedure: EXTRACORPOREAL SHOCK WAVE LITHOTRIPSY (ESWL);  Surgeon: Sondra Come, MD;  Location: [facility] ORS;  Service: Urology;  Laterality: Left;   HARDWARE REMOVAL     JOINT REPLACEMENT     TONSILLECTOMY     WISDOM TOOTH EXTRACTION       Family History Maternal uncle-CRC No h/o GI disease or malignancy  Review of Systems  Constitutional:  Negative for activity change, appetite change, chills, diaphoresis, fatigue, fever and unexpected weight change.  HENT:  Negative for trouble swallowing and voice change.   Respiratory:  Negative for shortness of breath and wheezing.   Cardiovascular:  Negative for chest pain, palpitations and leg swelling.  Gastrointestinal:  Negative for abdominal distention, abdominal pain, anal bleeding, blood in stool, constipation, diarrhea, nausea, rectal pain and vomiting.  Musculoskeletal:  Negative for arthralgias and myalgias.  Skin:  Negative for color change and pallor.  Neurological:  Negative for dizziness, syncope and weakness.  Psychiatric/Behavioral:  Negative for confusion.   All other systems reviewed and are negative.    Medications No current facility-administered medications on file prior to encounter.   Current Outpatient Medications on File Prior to Encounter  Medication Sig Dispense Refill   aspirin 81 MG EC tablet Take 81 mg by mouth at bedtime.      Calcium Carbonate-Vit D-Min (CALCIUM 600+D PLUS MINERALS) 600-400 MG-UNIT TABS Take 1 tablet by mouth daily.     Cyanocobalamin (VITAMIN B-12 CR) 1000 MCG TBCR Take 1 tablet by mouth daily.     meclizine (ANTIVERT) 12.5 MG tablet Take 1 tablet (12.5 mg total) by mouth 3 (three) times daily as needed for up  to 9 doses for dizziness. 9 tablet 0   NONFORMULARY OR COMPOUNDED ITEM Inject into the muscle once a week. Compounded allergy shot.     zoledronic acid (RECLAST) 5 MG/100ML SOLN injection Inject into the vein. Every 12 months      Pertinent medications related to GI and procedure were reviewed by me with the patient prior to the procedure  No current facility-administered medications for this encounter.  Current Outpatient Medications:    ACACIA SYRUP PO, 1.5 tsp per day, Disp: , Rfl:    aspirin 81 MG EC tablet,  Take 81 mg by mouth at bedtime. , Disp: , Rfl:    atorvastatin (LIPITOR) 40 MG tablet, Take 40 mg by mouth daily., Disp: , Rfl:    Calcium Carbonate-Vit D-Min (CALCIUM 600+D PLUS MINERALS) 600-400 MG-UNIT TABS, Take 1 tablet by mouth daily., Disp: , Rfl:    Cholecalciferol (VITAMIN D3) 50 MCG (2000 UT) capsule, Take 2,000 Units by mouth in the morning and at bedtime., Disp: , Rfl:    Cyanocobalamin (VITAMIN B-12 CR) 1000 MCG TBCR, Take 1 tablet by mouth daily., Disp: , Rfl:    levETIRAcetam (KEPPRA XR) 500 MG 24 hr tablet, Take 500 mg by mouth daily., Disp: , Rfl:    meclizine (ANTIVERT) 12.5 MG tablet, Take 1 tablet (12.5 mg total) by mouth 3 (three) times daily as needed for up to 9 doses for dizziness., Disp: 9 tablet, Rfl: 0   mometasone (NASONEX) 50 MCG/ACT nasal spray, Place 2 sprays into the nose daily., Disp: , Rfl:    NONFORMULARY OR COMPOUNDED ITEM, Inject into the muscle once a week. Compounded allergy shot., Disp: , Rfl:    zoledronic acid (RECLAST) 5 MG/100ML SOLN injection, Inject into the vein. Every 12 months, Disp: , Rfl:       Allergies  Allergen Reactions   Prednisone Nausea And Vomiting    Other reaction(s): Vomiting    Augmentin [Amoxicillin-Pot Clavulanate] Nausea And Vomiting   Codeine Other (See Comments)    Other reaction(s): Unknown Other reaction(s): Unknown Other reaction(s): Unknown 'feel really strange' no n/v    Levofloxacin    Omnicef [Cefdinir]    Trimethoprim Other (See Comments)   Erythromycin Diarrhea    Other reaction(s): Unknown Other reaction(s): Unknown   Sulfamethoxazole-Trimethoprim Diarrhea   Allergies were reviewed by me prior to the procedure  Objective   There is no height or weight on file to calculate BMI. There were no vitals filed for this visit.  *** Physical Exam Vitals and nursing note reviewed.  Constitutional:      General: She is not in acute distress.    Appearance: Normal appearance. She is not ill-appearing,  toxic-appearing or diaphoretic.  HENT:     Head: Normocephalic and atraumatic.     Nose: Nose normal.     Mouth/Throat:     Mouth: Mucous membranes are moist.     Pharynx: Oropharynx is clear.  Eyes:     General: No scleral icterus.    Extraocular Movements: Extraocular movements intact.  Cardiovascular:     Rate and Rhythm: Normal rate and regular rhythm.     Heart sounds: Normal heart sounds. No murmur heard.    No friction rub. No gallop.  Pulmonary:     Effort: Pulmonary effort is normal. No respiratory distress.     Breath sounds: Normal breath sounds. No wheezing, rhonchi or rales.  Abdominal:     General: Bowel sounds are normal. There is no distension.  Palpations: Abdomen is soft.     Tenderness: There is no abdominal tenderness. There is no guarding or rebound.  Musculoskeletal:     Cervical back: Neck supple.     Right lower leg: No edema.     Left lower leg: No edema.  Skin:    General: Skin is warm and dry.     Coloration: Skin is not jaundiced or pale.  Neurological:     Mental Status: She is alert and oriented to person, place, and time. Mental status is at baseline.  Psychiatric:        Mood and Affect: Mood normal.        Behavior: Behavior normal.        Thought Content: Thought content normal.        Judgment: Judgment normal.      Assessment:  [name] is a 77 y.o. female  who presents today for Colonoscopy for FIT positive .  Plan:  Colonoscopy with possible intervention today  Colonoscopy with possible biopsy, control of bleeding, polypectomy, and interventions as necessary has been discussed with the patient/patient representative. Informed consent was obtained from the patient/patient representative after explaining the indication, nature, and risks of the procedure including but not limited to death, bleeding, perforation, missed neoplasm/lesions, cardiorespiratory compromise, and reaction to medications. Opportunity for questions  was given and appropriate answers were provided. Patient/patient representative has verbalized understanding is amenable to undergoing the procedure.   Jaynie Collins, DO  [facility]  Portions of the record may have been created with voice recognition software. Occasional wrong-word or 'sound-a-like' substitutions may have occurred due to the inherent limitations of voice recognition software.  Read the chart carefully and recognize, using context, where substitutions may have occurred.

## 2023-04-18 ENCOUNTER — Ambulatory Visit
Admission: RE | Admit: 2023-04-18 | Discharge: 2023-04-18 | Disposition: A | Discharge status: Home / Self Care | Payer: Medicare PPO | Attending: Gastroenterology | Admitting: Gastroenterology

## 2023-04-18 ENCOUNTER — Ambulatory Visit: Payer: Medicare PPO | Admitting: Anesthesiology

## 2023-04-18 ENCOUNTER — Encounter
Admission: RE | Disposition: A | Discharge status: Home / Self Care | Payer: Self-pay | Source: Home / Self Care | Attending: Gastroenterology

## 2023-04-18 ENCOUNTER — Other Ambulatory Visit: Payer: Self-pay

## 2023-04-18 ENCOUNTER — Encounter: Payer: Self-pay | Admitting: Gastroenterology

## 2023-04-18 DIAGNOSIS — D128 Benign neoplasm of rectum: Secondary | ICD-10-CM | POA: Diagnosis not present

## 2023-04-18 DIAGNOSIS — D124 Benign neoplasm of descending colon: Secondary | ICD-10-CM | POA: Diagnosis not present

## 2023-04-18 DIAGNOSIS — D123 Benign neoplasm of transverse colon: Secondary | ICD-10-CM | POA: Insufficient documentation

## 2023-04-18 DIAGNOSIS — Z7902 Long term (current) use of antithrombotics/antiplatelets: Secondary | ICD-10-CM | POA: Insufficient documentation

## 2023-04-18 DIAGNOSIS — Z8719 Personal history of other diseases of the digestive system: Secondary | ICD-10-CM | POA: Insufficient documentation

## 2023-04-18 DIAGNOSIS — Z1211 Encounter for screening for malignant neoplasm of colon: Secondary | ICD-10-CM | POA: Diagnosis present

## 2023-04-18 DIAGNOSIS — K640 First degree hemorrhoids: Secondary | ICD-10-CM | POA: Diagnosis not present

## 2023-04-18 DIAGNOSIS — Z7982 Long term (current) use of aspirin: Secondary | ICD-10-CM | POA: Insufficient documentation

## 2023-04-18 DIAGNOSIS — Z800 Family history of malignant neoplasm of digestive organs: Secondary | ICD-10-CM | POA: Diagnosis not present

## 2023-04-18 DIAGNOSIS — R195 Other fecal abnormalities: Secondary | ICD-10-CM | POA: Insufficient documentation

## 2023-04-18 HISTORY — PX: COLONOSCOPY WITH PROPOFOL: SHX5780

## 2023-04-18 SURGERY — COLONOSCOPY WITH PROPOFOL
Anesthesia: General

## 2023-04-18 MED ORDER — PROPOFOL 10 MG/ML IV BOLUS
INTRAVENOUS | Status: DC | PRN
  Administered 2023-04-18 (×5): 40 mg via INTRAVENOUS
  Administered 2023-04-18: 90 mg via INTRAVENOUS

## 2023-04-18 MED ORDER — GLYCOPYRROLATE 0.2 MG/ML IJ SOLN
INTRAMUSCULAR | Status: DC | PRN
  Administered 2023-04-18: .4 mg via INTRAVENOUS

## 2023-04-18 MED ORDER — SODIUM CHLORIDE 0.9 % IV SOLN: INTRAVENOUS | Status: DC

## 2023-04-18 MED ORDER — LIDOCAINE HCL (CARDIAC) PF 100 MG/5ML IV SOSY
PREFILLED_SYRINGE | INTRAVENOUS | Status: DC | PRN
  Administered 2023-04-18: 50 mg via INTRAVENOUS

## 2023-04-18 NOTE — Anesthesia Preprocedure Evaluation (Signed)
Anesthesia Evaluation  Patient identified by MRN, date of birth, ID band Patient awake  General Assessment Comment:Patient states that in 2009 she had a vasovagal response during a colonoscopy (sounds like bradycardia and hypotension). No records for this are available for review. She said at her recent colonoscopy in 2019, she did not have this (chart review shows that she was pre medicated with 0.4mg  of glycopyrrolate).  Reviewed: Allergy & Precautions, NPO status , Patient's Chart, lab work & pertinent test results  History of Anesthesia Complications Negative for: history of anesthetic complications  Airway Mallampati: II  TM Distance: >3 FB Neck ROM: Full    Dental no notable dental hx. (+) Teeth Intact   Pulmonary neg pulmonary ROS, neg sleep apnea, neg COPD, Patient abstained from smoking.Not current smoker   Pulmonary exam normal breath sounds clear to auscultation       Cardiovascular Exercise Tolerance: Good METS(-) hypertension(-) CAD and (-) Past MI (-) dysrhythmias  Rhythm:Regular Rate:Normal - Systolic murmurs    Neuro/Psych  PSYCHIATRIC DISORDERS  Depression    Stroke 5 months ago, no residual deficits, no longer on blood thinners. Echo at time of stroke was unremarkable. CVA, No Residual Symptoms    GI/Hepatic ,neg GERD  ,,(+)     (-) substance abuse    Endo/Other  neg diabetesHypothyroidism    Renal/GU negative Renal ROS     Musculoskeletal   Abdominal   Peds  Hematology   Anesthesia Other Findings Past Medical History: No date: Allergic genetic state No date: Arthritis No date: Cholelithiasis No date: Depression No date: Diarrhea No date: Fibroids No date: Hearing loss No date: Hemorrhoids No date: Hypothyroidism No date: IBS (irritable bowel syndrome) No date: Kidney stones No date: Osteoarthrosis No date: Plantar fasciitis No date: Seizure (HCC) No date: Stroke (HCC) No date:  Synovitis No date: TIA (transient ischemic attack)  Reproductive/Obstetrics                             Anesthesia Physical Anesthesia Plan  ASA: 3  Anesthesia Plan: General   Post-op Pain Management: Minimal or no pain anticipated   Induction: Intravenous  PONV Risk Score and Plan: 3 and Propofol infusion, TIVA and Ondansetron  Airway Management Planned: Nasal Cannula  Additional Equipment: None  Intra-op Plan:   Post-operative Plan:   Informed Consent: I have reviewed the patients History and Physical, chart, labs and discussed the procedure including the risks, benefits and alternatives for the proposed anesthesia with the patient or authorized representative who has indicated his/her understanding and acceptance.     Dental advisory given  Plan Discussed with: CRNA and Surgeon  Anesthesia Plan Comments: (Discussed risks of anesthesia with patient, including possibility of difficulty with spontaneous ventilation under anesthesia necessitating airway intervention, PONV, and rare risks such as cardiac or respiratory or neurological events, and allergic reactions. Discussed the role of CRNA in patient's perioperative care. Patient understands.)       Anesthesia Quick Evaluation

## 2023-04-18 NOTE — Interval H&P Note (Signed)
History and Physical Interval Note:Preprocedure H&P from 04/18/23  was reviewed and there was no interval change after seeing and examining the patient.  Written consent was obtained from the patient after discussion of risks, benefits, and alternatives. Patient has consented to proceed with Colonoscopy with possible intervention   04/18/2023 3:36 PM  [name] [name]  has presented today for surgery, with the diagnosis of Hem(+) stool,rectal bleeding.  The various methods of treatment have been discussed with the patient and family. After consideration of risks, benefits and other options for treatment, the patient has consented to  Procedure(s): COLONOSCOPY WITH PROPOFOL (N/A) as a surgical intervention.  The patient's history has been reviewed, patient examined, no change in status, stable for surgery.  I have reviewed the patient's chart and labs.  Questions were answered to the patient's satisfaction.     Jaynie Collins

## 2023-04-18 NOTE — Op Note (Signed)
[facility] Gastroenterology Patient Name: [name] [name] Procedure Date: 04/18/2023 3:30 PM MRN: [id number] Account #: [id number] Date of Birth: 1945-12-27 Admit Type: Outpatient Age: 77 Room: [facility] ENDO ROOM 2 Gender: Female Note Status: Finalized Instrument Name: Peds Colonoscope [id number] Procedure:             Colonoscopy Indications:           Positive fecal immunochemical test Providers:             Jaynie Collins DO, DO Medicines:             Monitored Anesthesia Care Complications:         No immediate complications. Estimated blood loss:                         Minimal. Procedure:             Pre-Anesthesia Assessment:                        - Prior to the procedure, a History and Physical was                         performed, and patient medications and allergies were                         reviewed. The patient is competent. The risks and                         benefits of the procedure and the sedation options and                         risks were discussed with the patient. All questions                         were answered and informed consent was obtained.                         Patient identification and proposed procedure were                         verified by the physician, the nurse, the anesthetist                         and the technician in the endoscopy suite. Mental                         Status Examination: alert and oriented. Airway                         Examination: normal oropharyngeal airway and neck                         mobility. Respiratory Examination: clear to                         auscultation. CV Examination: RRR, no murmurs, no S3                         or S4. Prophylactic Antibiotics: The patient does not  require prophylactic antibiotics. Prior                         Anticoagulants: The patient has taken no anticoagulant                         or antiplatelet agents. ASA Grade  Assessment: III - A                         patient with severe systemic disease. After reviewing                         the risks and benefits, the patient was deemed in                         satisfactory condition to undergo the procedure. The                         anesthesia plan was to use monitored anesthesia care                         (MAC). Immediately prior to administration of                         medications, the patient was re-assessed for adequacy                         to receive sedatives. The heart rate, respiratory                         rate, oxygen saturations, blood pressure, adequacy of                         pulmonary ventilation, and response to care were                         monitored throughout the procedure. The physical                         status of the patient was re-assessed after the                         procedure.                        After obtaining informed consent, the colonoscope was                         passed under direct vision. Throughout the procedure,                         the patient's blood pressure, pulse, and oxygen                         saturations were monitored continuously. The                         Colonoscope was introduced through the anus and  advanced to the the terminal ileum, with                         identification of the appendiceal orifice and IC                         valve. The colonoscopy was performed without                         difficulty. The patient tolerated the procedure well.                         The quality of the bowel preparation was evaluated                         using the BBPS [facility] Bowel Preparation Scale) with                         scores of: Right Colon = 2 (minor amount of residual                         staining, small fragments of stool and/or opaque                         liquid, but mucosa seen well), Transverse Colon = 2                          (minor amount of residual staining, small fragments of                         stool and/or opaque liquid, but mucosa seen well) and                         Left Colon = 2 (minor amount of residual staining,                         small fragments of stool and/or opaque liquid, but                         mucosa seen well). The total BBPS score equals 6. The                         quality of the bowel preparation was good. The                         terminal ileum, ileocecal valve, appendiceal orifice,                         and rectum were photographed. Findings:      The perianal and digital rectal examinations were normal. Pertinent       negatives include normal sphincter tone.      The terminal ileum appeared normal. Estimated blood loss: none.      Retroflexion in the right colon was performed.      A 3 to 4 mm polyp was found in the transverse colon. The polyp was       sessile. The polyp was removed with  a cold snare. Resection and       retrieval were complete. Estimated blood loss was minimal. To prevent       bleeding after the polypectomy, one hemostatic clip was successfully       placed (MR conditional). There was no bleeding at the end of the       procedure. Estimated blood loss: none.      Four sessile polyps were found in the rectum (2) and descending colon       (2). The polyps were 1 to 2 mm in size. These polyps were removed with a       jumbo cold forceps. Resection and retrieval were complete. Estimated       blood loss was minimal.      Non-bleeding internal hemorrhoids were found during retroflexion. The       hemorrhoids were Grade I (internal hemorrhoids that do not prolapse).       Estimated blood loss: none.      The exam was otherwise without abnormality on direct and retroflexion       views. Impression:            - The examined portion of the ileum was normal.                        - One 3 to 4 mm polyp in the transverse colon, removed                          with a cold snare. Resected and retrieved. Clip (MR                         conditional) was placed.                        - Four 1 to 2 mm polyps in the rectum and in the                         descending colon, removed with a jumbo cold forceps.                         Resected and retrieved.                        - Non-bleeding internal hemorrhoids.                        - The examination was otherwise normal on direct and                         retroflexion views. Recommendation:        - Patient has a contact number available for                         emergencies. The signs and symptoms of potential                         delayed complications were discussed with the patient.                         Return to normal activities tomorrow. Written  discharge instructions were provided to the patient.                        - Discharge patient to home.                        - Resume previous diet.                        - Continue present medications.                        - No ibuprofen, naproxen, or other non-steroidal                         anti-inflammatory drugs for 5 days after polyp removal.                        - Await pathology results.                        - Repeat colonoscopy for surveillance based on                         pathology results.                        - Return to referring physician as previously                         scheduled.                        - The findings and recommendations were discussed with                         the patient. Procedure Code(s):     --- Professional ---                        [phone], Colonoscopy, flexible; with removal of                         tumor(s), polyp(s), or other lesion(s) by snare                         technique                        45380, 59, Colonoscopy, flexible; with biopsy, single                         or multiple Diagnosis Code(s):     ---  Professional ---                        K64.0, First degree hemorrhoids                        D12.3, Benign neoplasm of transverse colon (hepatic                         flexure or splenic flexure)  D12.8, Benign neoplasm of rectum                        D12.4, Benign neoplasm of descending colon                        R19.5, Other fecal abnormalities CPT copyright 2022 American [employer]. All rights reserved. The codes documented in this report are preliminary and upon coder review may  be revised to meet current compliance requirements. Attending Participation:      I personally performed the entire procedure. Elfredia Nevins, DO Jaynie Collins DO, DO 04/18/2023 4:16:23 PM This report has been signed electronically. Number of Addenda: 0 Note Initiated On: 04/18/2023 3:30 PM Scope Withdrawal Time: 0 hours 16 minutes 42 seconds  Total Procedure Duration: 0 hours 21 minutes 44 seconds  Estimated Blood Loss:  Estimated blood loss was minimal.      [facility]

## 2023-04-18 NOTE — Transfer of Care (Signed)
Immediate Anesthesia Transfer of Care Note  Patient: [name] [name]  Procedure(s) Performed: COLONOSCOPY WITH PROPOFOL  Patient Location: Endoscopy Unit  Anesthesia Type:General  Level of Consciousness: drowsy  Airway & Oxygen Therapy: Patient Spontanous Breathing and Patient connected to nasal cannula oxygen  Post-op Assessment: Report given to RN, Post -op Vital signs reviewed and stable, and Patient moving all extremities  Post vital signs: Reviewed and stable  Last Vitals:  Vitals Value Taken Time  BP 102/57 04/18/23 1608  Temp    Pulse 83 04/18/23 1608  Resp 13 04/18/23 1608  SpO2 100 % 04/18/23 1608    Last Pain:  Vitals:   04/18/23 1608  TempSrc:   PainSc: Asleep         Complications: No notable events documented.

## 2023-04-18 NOTE — Anesthesia Postprocedure Evaluation (Signed)
Anesthesia Post Note  Patient: [name] [name]  Procedure(s) Performed: COLONOSCOPY WITH PROPOFOL  Patient location during evaluation: PACU Anesthesia Type: General Level of consciousness: awake and alert, oriented and patient cooperative Pain management: pain level controlled Vital Signs Assessment: post-procedure vital signs reviewed and stable Respiratory status: spontaneous breathing, nonlabored ventilation and respiratory function stable Cardiovascular status: blood pressure returned to baseline and stable Postop Assessment: adequate PO intake Anesthetic complications: no   No notable events documented.   Last Vitals:  Vitals:   04/18/23 1618 04/18/23 1628  BP: 109/67 118/77  Pulse: 84 86  Resp: 17 18  Temp:    SpO2: 100% 100%    Last Pain:  Vitals:   04/18/23 1628  TempSrc:   PainSc: 0-No pain                 Reed Breech

## 2023-04-19 ENCOUNTER — Encounter: Payer: Self-pay | Admitting: Gastroenterology

## 2023-05-17 ENCOUNTER — Other Ambulatory Visit: Payer: Self-pay | Admitting: Family Medicine

## 2023-05-17 DIAGNOSIS — R1011 Right upper quadrant pain: Secondary | ICD-10-CM

## 2023-05-19 ENCOUNTER — Ambulatory Visit
Admission: RE | Admit: 2023-05-19 | Discharge: 2023-05-19 | Disposition: A | Discharge status: Home / Self Care | Payer: Medicare PPO | Source: Ambulatory Visit | Attending: Family Medicine | Admitting: Family Medicine

## 2023-05-19 DIAGNOSIS — R1011 Right upper quadrant pain: Secondary | ICD-10-CM | POA: Diagnosis present

## 2023-05-25 ENCOUNTER — Other Ambulatory Visit: Payer: Self-pay | Admitting: Surgery

## 2023-05-25 DIAGNOSIS — R1011 Right upper quadrant pain: Secondary | ICD-10-CM

## 2023-05-26 ENCOUNTER — Ambulatory Visit
Admission: RE | Admit: 2023-05-26 | Discharge: 2023-05-26 | Disposition: A | Discharge status: Home / Self Care | Payer: Medicare PPO | Source: Ambulatory Visit | Attending: Surgery | Admitting: Surgery

## 2023-05-26 DIAGNOSIS — R1011 Right upper quadrant pain: Secondary | ICD-10-CM | POA: Insufficient documentation

## 2023-05-26 MED ORDER — TECHNETIUM TC 99M MEBROFENIN IV KIT
5.0000 | PACK | Freq: Once | INTRAVENOUS | Status: AC
  Administered 2023-05-26: 5.45 via INTRAVENOUS

## 2023-08-21 ENCOUNTER — Other Ambulatory Visit: Payer: Self-pay

## 2023-08-21 ENCOUNTER — Inpatient Hospital Stay
Admission: EM | Admit: 2023-08-21 | Discharge: 2023-08-24 | DRG: 163 | Disposition: A | Discharge status: Home Health | Payer: Medicare PPO | Attending: Internal Medicine | Admitting: Internal Medicine

## 2023-08-21 ENCOUNTER — Emergency Department: Payer: Medicare PPO

## 2023-08-21 ENCOUNTER — Encounter: Payer: Self-pay | Admitting: Emergency Medicine

## 2023-08-21 ENCOUNTER — Inpatient Hospital Stay
Admit: 2023-08-21 | Discharge: 2023-08-21 | Disposition: A | Discharge status: Home / Self Care | Payer: Medicare PPO | Attending: Family Medicine | Admitting: Family Medicine

## 2023-08-21 DIAGNOSIS — Z9660 Presence of unspecified orthopedic joint implant: Secondary | ICD-10-CM | POA: Diagnosis present

## 2023-08-21 DIAGNOSIS — Z79899 Other long term (current) drug therapy: Secondary | ICD-10-CM

## 2023-08-21 DIAGNOSIS — Z883 Allergy status to other anti-infective agents status: Secondary | ICD-10-CM | POA: Diagnosis not present

## 2023-08-21 DIAGNOSIS — M79605 Pain in left leg: Secondary | ICD-10-CM | POA: Diagnosis not present

## 2023-08-21 DIAGNOSIS — Z9889 Other specified postprocedural states: Secondary | ICD-10-CM

## 2023-08-21 DIAGNOSIS — I480 Paroxysmal atrial fibrillation: Secondary | ICD-10-CM | POA: Diagnosis present

## 2023-08-21 DIAGNOSIS — Z7901 Long term (current) use of anticoagulants: Secondary | ICD-10-CM

## 2023-08-21 DIAGNOSIS — Z7982 Long term (current) use of aspirin: Secondary | ICD-10-CM

## 2023-08-21 DIAGNOSIS — N390 Urinary tract infection, site not specified: Secondary | ICD-10-CM | POA: Diagnosis present

## 2023-08-21 DIAGNOSIS — I2699 Other pulmonary embolism without acute cor pulmonale: Secondary | ICD-10-CM | POA: Diagnosis present

## 2023-08-21 DIAGNOSIS — Z87442 Personal history of urinary calculi: Secondary | ICD-10-CM | POA: Diagnosis not present

## 2023-08-21 DIAGNOSIS — Z841 Family history of disorders of kidney and ureter: Secondary | ICD-10-CM

## 2023-08-21 DIAGNOSIS — K8020 Calculus of gallbladder without cholecystitis without obstruction: Secondary | ICD-10-CM | POA: Diagnosis present

## 2023-08-21 DIAGNOSIS — J9601 Acute respiratory failure with hypoxia: Secondary | ICD-10-CM | POA: Diagnosis present

## 2023-08-21 DIAGNOSIS — K8080 Other cholelithiasis without obstruction: Secondary | ICD-10-CM

## 2023-08-21 DIAGNOSIS — F32A Depression, unspecified: Secondary | ICD-10-CM | POA: Diagnosis present

## 2023-08-21 DIAGNOSIS — Z809 Family history of malignant neoplasm, unspecified: Secondary | ICD-10-CM

## 2023-08-21 DIAGNOSIS — I2609 Other pulmonary embolism with acute cor pulmonale: Secondary | ICD-10-CM | POA: Diagnosis present

## 2023-08-21 DIAGNOSIS — Z602 Problems related to living alone: Secondary | ICD-10-CM | POA: Diagnosis present

## 2023-08-21 DIAGNOSIS — K589 Irritable bowel syndrome without diarrhea: Secondary | ICD-10-CM | POA: Diagnosis present

## 2023-08-21 DIAGNOSIS — Z882 Allergy status to sulfonamides status: Secondary | ICD-10-CM

## 2023-08-21 DIAGNOSIS — E039 Hypothyroidism, unspecified: Secondary | ICD-10-CM | POA: Diagnosis present

## 2023-08-21 DIAGNOSIS — Z9089 Acquired absence of other organs: Secondary | ICD-10-CM

## 2023-08-21 DIAGNOSIS — Z8719 Personal history of other diseases of the digestive system: Secondary | ICD-10-CM

## 2023-08-21 DIAGNOSIS — G40909 Epilepsy, unspecified, not intractable, without status epilepticus: Secondary | ICD-10-CM | POA: Diagnosis present

## 2023-08-21 DIAGNOSIS — H9190 Unspecified hearing loss, unspecified ear: Secondary | ICD-10-CM | POA: Diagnosis present

## 2023-08-21 DIAGNOSIS — Z8673 Personal history of transient ischemic attack (TIA), and cerebral infarction without residual deficits: Secondary | ICD-10-CM

## 2023-08-21 DIAGNOSIS — Z803 Family history of malignant neoplasm of breast: Secondary | ICD-10-CM

## 2023-08-21 DIAGNOSIS — M722 Plantar fascial fibromatosis: Secondary | ICD-10-CM | POA: Diagnosis present

## 2023-08-21 DIAGNOSIS — Z888 Allergy status to other drugs, medicaments and biological substances status: Secondary | ICD-10-CM

## 2023-08-21 DIAGNOSIS — I2694 Multiple subsegmental pulmonary emboli without acute cor pulmonale: Secondary | ICD-10-CM | POA: Diagnosis present

## 2023-08-21 DIAGNOSIS — Z881 Allergy status to other antibiotic agents status: Secondary | ICD-10-CM

## 2023-08-21 DIAGNOSIS — Z885 Allergy status to narcotic agent status: Secondary | ICD-10-CM

## 2023-08-21 DIAGNOSIS — M1990 Unspecified osteoarthritis, unspecified site: Secondary | ICD-10-CM | POA: Diagnosis present

## 2023-08-21 LAB — COMPREHENSIVE METABOLIC PANEL
ALT: 26 U/L (ref 0–44)
AST: 32 U/L (ref 15–41)
Albumin: 4.3 g/dL (ref 3.5–5.0)
Alkaline Phosphatase: 82 U/L (ref 38–126)
Anion gap: 12 (ref 5–15)
BUN: 14 mg/dL (ref 8–23)
CO2: 24 mmol/L (ref 22–32)
Calcium: 8.7 mg/dL — ABNORMAL LOW (ref 8.9–10.3)
Chloride: 101 mmol/L (ref 98–111)
Creatinine, Ser: 0.82 mg/dL (ref 0.44–1.00)
GFR, Estimated: >60 mL/min (ref >60)
Glucose, Bld: 120 mg/dL — ABNORMAL HIGH (ref 70–99)
Potassium: 3.7 mmol/L (ref 3.5–5.1)
Sodium: 137 mmol/L (ref 135–145)
Total Bilirubin: 1.4 mg/dL — ABNORMAL HIGH (ref 0.3–1.2)
Total Protein: 7.9 g/dL (ref 6.5–8.1)

## 2023-08-21 LAB — CBC WITH DIFFERENTIAL/PLATELET
Abs Immature Granulocytes: 0.03 10*3/uL (ref 0.00–0.07)
Basophils Absolute: 0.1 10*3/uL (ref 0.0–0.1)
Basophils Relative: 1 %
Eosinophils Absolute: 0.1 10*3/uL (ref 0.0–0.5)
Eosinophils Relative: 1 %
HCT: 45.6 % (ref 36.0–46.0)
Hemoglobin: 15.1 g/dL — ABNORMAL HIGH (ref 12.0–15.0)
Immature Granulocytes: 0 %
Lymphocytes Relative: 14 %
Lymphs Abs: 1.4 10*3/uL (ref 0.7–4.0)
MCH: 31.3 pg (ref 26.0–34.0)
MCHC: 33.1 g/dL (ref 30.0–36.0)
MCV: 94.4 fL (ref 80.0–100.0)
Monocytes Absolute: 1 10*3/uL (ref 0.1–1.0)
Monocytes Relative: 10 %
Neutro Abs: 8 10*3/uL — ABNORMAL HIGH (ref 1.7–7.7)
Neutrophils Relative %: 74 %
Platelets: 240 10*3/uL (ref 150–400)
RBC: 4.83 MIL/uL (ref 3.87–5.11)
RDW: 13.1 % (ref 11.5–15.5)
WBC: 10.7 10*3/uL — ABNORMAL HIGH (ref 4.0–10.5)
nRBC: 0 % (ref 0.0–0.2)

## 2023-08-21 LAB — HEPARIN LEVEL (UNFRACTIONATED): Heparin Unfractionated: >1.1 [IU]/mL — ABNORMAL HIGH (ref 0.30–0.70)

## 2023-08-21 LAB — URINALYSIS, ROUTINE W REFLEX MICROSCOPIC
Bilirubin Urine: NEGATIVE
Glucose, UA: NEGATIVE mg/dL
Hgb urine dipstick: NEGATIVE
Ketones, ur: NEGATIVE mg/dL
Nitrite: NEGATIVE
Protein, ur: NEGATIVE mg/dL
Specific Gravity, Urine: 1.016 (ref 1.005–1.030)
WBC, UA: >50 WBC/hpf (ref 0–5)
pH: 6 (ref 5.0–8.0)

## 2023-08-21 LAB — ECHOCARDIOGRAM COMPLETE
AR max vel: 1.91 cm2
AV Peak grad: 7 mm[Hg]
Ao pk vel: 1.32 m/s
Area-P 1/2: 3.08 cm2
Height: 63 in
S' Lateral: 2.5 cm
Weight: 2493.84 [oz_av]

## 2023-08-21 LAB — APTT: aPTT: 28 s (ref 24–36)

## 2023-08-21 LAB — PROTIME-INR
INR: 1.1 (ref 0.8–1.2)
Prothrombin Time: 14.3 s (ref 11.4–15.2)

## 2023-08-21 LAB — LIPASE, BLOOD: Lipase: 42 U/L (ref 11–51)

## 2023-08-21 LAB — TROPONIN I (HIGH SENSITIVITY): Troponin I (High Sensitivity): 5 ng/L (ref <18)

## 2023-08-21 MED ORDER — HEPARIN (PORCINE) 25000 UT/250ML-% IV SOLN: 750.0000 [IU]/h | INTRAVENOUS | Status: DC

## 2023-08-21 MED ORDER — LEVETIRACETAM ER 500 MG PO TB24
500.0000 mg | ORAL_TABLET | Freq: Once | ORAL | Status: DC
  Filled 2023-08-21: qty 1

## 2023-08-21 MED ORDER — IOHEXOL 350 MG/ML SOLN
50.0000 mL | Freq: Once | INTRAVENOUS | Status: AC | PRN
  Administered 2023-08-21: 50 mL via INTRAVENOUS

## 2023-08-21 MED ORDER — HEPARIN (PORCINE) 25000 UT/250ML-% IV SOLN
1200.0000 [IU]/h | INTRAVENOUS | Status: DC
  Administered 2023-08-21: 1200 [IU]/h via INTRAVENOUS
  Filled 2023-08-21: qty 250

## 2023-08-21 MED ORDER — MORPHINE SULFATE (PF) 4 MG/ML IV SOLN
4.0000 mg | INTRAVENOUS | Status: DC | PRN
  Administered 2023-08-21: 4 mg via INTRAVENOUS
  Filled 2023-08-21: qty 1

## 2023-08-21 MED ORDER — SODIUM CHLORIDE 0.9 % IV BOLUS
500.0000 mL | Freq: Once | INTRAVENOUS | Status: AC
  Administered 2023-08-21: 500 mL via INTRAVENOUS

## 2023-08-21 MED ORDER — HEPARIN BOLUS VIA INFUSION
4500.0000 [IU] | Freq: Once | INTRAVENOUS | Status: DC
  Filled 2023-08-21: qty 4500

## 2023-08-21 MED ORDER — HEPARIN (PORCINE) 25000 UT/250ML-% IV SOLN: 1200.0000 [IU]/h | INTRAVENOUS | Status: DC

## 2023-08-21 MED ORDER — IOHEXOL 300 MG/ML  SOLN
100.0000 mL | Freq: Once | INTRAMUSCULAR | Status: AC | PRN
  Administered 2023-08-21: 75 mL via INTRAVENOUS

## 2023-08-21 MED ORDER — HEPARIN BOLUS VIA INFUSION
4500.0000 [IU] | Freq: Once | INTRAVENOUS | Status: AC
  Administered 2023-08-21: 4500 [IU] via INTRAVENOUS
  Filled 2023-08-21: qty 4500

## 2023-08-21 MED ORDER — ONDANSETRON HCL 4 MG/2ML IJ SOLN
4.0000 mg | Freq: Once | INTRAMUSCULAR | Status: AC
  Administered 2023-08-21: 4 mg via INTRAVENOUS
  Filled 2023-08-21: qty 2

## 2023-08-21 MED ORDER — LEVETIRACETAM ER 500 MG PO TB24
1000.0000 mg | ORAL_TABLET | Freq: Every day | ORAL | Status: DC
  Administered 2023-08-21 – 2023-08-23 (×3): 1000 mg via ORAL
  Filled 2023-08-21 (×4): qty 2

## 2023-08-21 MED ORDER — LEVETIRACETAM ER 500 MG PO TB24: 1000.0000 mg | ORAL_TABLET | Freq: Every day | ORAL | Status: DC

## 2023-08-21 MED ORDER — MORPHINE SULFATE (PF) 4 MG/ML IV SOLN
4.0000 mg | Freq: Once | INTRAVENOUS | Status: AC
  Administered 2023-08-21: 4 mg via INTRAVENOUS
  Filled 2023-08-21: qty 1

## 2023-08-21 MED ORDER — HYDROMORPHONE HCL 1 MG/ML IJ SOLN
1.0000 mg | INTRAMUSCULAR | Status: DC | PRN
  Administered 2023-08-21 – 2023-08-24 (×10): 1 mg via INTRAVENOUS
  Filled 2023-08-21 (×12): qty 1

## 2023-08-21 MED ORDER — ATORVASTATIN CALCIUM 20 MG PO TABS
40.0000 mg | ORAL_TABLET | Freq: Every day | ORAL | Status: DC
  Administered 2023-08-21 – 2023-08-23 (×2): 40 mg via ORAL
  Filled 2023-08-21 (×3): qty 2

## 2023-08-21 MED ORDER — LEVETIRACETAM ER 500 MG PO TB24
500.0000 mg | ORAL_TABLET | Freq: Every day | ORAL | Status: DC
  Filled 2023-08-21: qty 1

## 2023-08-21 NOTE — ED Provider Notes (Signed)
[facility] Provider Note    Event Date/Time   First MD Initiated Contact with Patient 08/21/23 0920     (approximate)   History   Abdominal Pain   HPI  [name] [name] is a 77 y.o. female with history of cholelithiasis, kidney stone, hypothyroidism seizures, and CVA presents emergency department with right upper quadrant pain.  Patient states pain started last night.  Radiates into her right side and up into her right shoulder.  Had a workup for her gallbladder a month or so ago and was told they would not do surgery right away.  She denies fever or chills.      Physical Exam   Triage Vital Signs: ED Triage Vitals  Encounter Vitals Group     BP 08/21/23 0854 111/79     Systolic BP Percentile --      Diastolic BP Percentile --      Pulse Rate 08/21/23 0854 87     Resp 08/21/23 0854 18     Temp 08/21/23 0854 98.2 F (36.8 C)     Temp Source 08/21/23 0854 Oral     SpO2 08/21/23 0854 98 %     Weight 08/21/23 0855 155 lb 13.8 oz (70.7 kg)     Height 08/21/23 0855 5\' 3"  (1.6 m)     Head Circumference --      Peak Flow --      Pain Score 08/21/23 0855 7     Pain Loc --      Pain Education --      Exclude from Growth Chart --     Most recent vital signs: Vitals:   08/21/23 0854  BP: 111/79  Pulse: 87  Resp: 18  Temp: 98.2 F (36.8 C)  SpO2: 98%     General: Awake, no distress.  Patient looks very uncomfortable CV:  Good peripheral perfusion. regular rate and  rhythm Resp:  Normal effort. Lungs cta Abd:  No distention.  Right upper quadrant tender, positive Murphy sign, bowel sounds normal Other:      ED Results / Procedures / Treatments   Labs (all labs ordered are listed, but only abnormal results are displayed) Labs Reviewed  COMPREHENSIVE METABOLIC PANEL - Abnormal; Notable for the following components:      Result Value   Glucose, Bld 120 (*)    Calcium 8.7 (*)    Total Bilirubin 1.4 (*)    All other components within  normal limits  CBC WITH DIFFERENTIAL/PLATELET - Abnormal; Notable for the following components:   WBC 10.7 (*)    Hemoglobin 15.1 (*)    Neutro Abs 8.0 (*)    All other components within normal limits  URINALYSIS, ROUTINE W REFLEX MICROSCOPIC - Abnormal; Notable for the following components:   Color, Urine YELLOW (*)    APPearance HAZY (*)    Leukocytes,Ua LARGE (*)    Bacteria, UA RARE (*)    Non Squamous Epithelial PRESENT (*)    All other components within normal limits  URINE CULTURE  LIPASE, BLOOD  TROPONIN I (HIGH SENSITIVITY)     EKG     RADIOLOGY CT abdomen pelvis IV contrast    PROCEDURES:   Procedures   MEDICATIONS ORDERED IN ED: Medications  morphine (PF) 4 MG/ML injection 4 mg (4 mg Intravenous Given 08/21/23 0937)  ondansetron (ZOFRAN) injection 4 mg (4 mg Intravenous Given 08/21/23 0933)  iohexol (OMNIPAQUE) 300 MG/ML solution 100 mL (75 mLs Intravenous Contrast Given 08/21/23 1006)  sodium chloride 0.9 % bolus 500 mL (0 mLs Intravenous Stopped 08/21/23 1150)  iohexol (OMNIPAQUE) 350 MG/ML injection 50 mL (50 mLs Intravenous Contrast Given 08/21/23 1052)  morphine (PF) 4 MG/ML injection 4 mg (4 mg Intravenous Given 08/21/23 1148)     IMPRESSION / MDM / ASSESSMENT AND PLAN / ED COURSE  I reviewed the triage vital signs and the nursing notes.                              Differential diagnosis includes, but is not limited to, acute cholecystitis, acute choledocholithiasis, kidney stone, bowel obstruction, pancreatitis  Patient's presentation is most consistent with acute presentation with potential threat to life or bodily function.   Labs with elevated WBC of 10.7, total bili elevated at 1.4, urinalysis does show a large amount of leuks but no bacteria.  Lipase is reassuring  Clinical Course as of 08/21/23 1158  Sun Aug 21, 2023  1053 Of note, patient's labs are generally reassuring.  We added on an EKG and I added on a high-sensitivity  troponin.  Her urinalysis is positive for UTI and I ordered a urine culture.  Given the extent of large leukocytes and greater than 50 WBCs, I anticipate treating empirically. [CF]  1122 Troponin I (High Sensitivity): 5 [CF]  1137 Radiologist called me to discuss the case.  I also viewed and interpreted the CTA chest myself.  There is obvious area of infarction bilaterally with some small pleural effusions.  Radiologist reports bilateral subsegmental PEs with evidence of right heart strain. [CF]  1145 Updated patient.  She is still in a significant amount of pain and I ordered another 4 mg of IV morphine.  She still does not feel any dyspnea.  We talked about the results and given her ongoing pain, the right heart strain on the CT scan, her history of nosebleeds on anticoagulation, her pleural effusions, etc., I believe that the risk of sending her home and developing complication is too high.  I am consulting the hospitalist service for admission.  I will begin anticoagulation process, but I will first consult with the hospitalist to see if they would prefer heparin or enoxaparin so that we do not order one and then change the plan midway. [CF]    Clinical Course User Index [CF] Loleta Rose, MD  CT abdomen pelvis IV contrast ordered  Patient given morphine 4 mg IV, Zofran 4 mg IV for pain.  Radiologist called to inform me that the patient has a very large PE in the right lung that was seen on the CT abdomen pelvis.  Dr. York Cerise and see the patient.  He will be assuming care at this time.   FINAL CLINICAL IMPRESSION(S) / ED DIAGNOSES   Final diagnoses:  Urinary tract infection without hematuria, site unspecified  Acute pulmonary embolism with acute cor pulmonale, unspecified pulmonary embolism type (HCC)     Rx / DC Orders   ED Discharge Orders     None        Note:  This document was prepared using Dragon voice recognition software and may include unintentional dictation  errors.    Faythe Ghee, PA-C 08/21/23 1158    Loleta Rose, MD 08/21/23 2115

## 2023-08-21 NOTE — Consult Note (Signed)
PHARMACY - ANTICOAGULATION CONSULT NOTE  Pharmacy Consult for Heparin  Indication: pulmonary embolus  Allergies  Allergen Reactions   Prednisone Nausea And Vomiting    Other reaction(s): Vomiting    Augmentin [Amoxicillin-Pot Clavulanate] Nausea And Vomiting   Codeine Other (See Comments)    Other reaction(s): Unknown Other reaction(s): Unknown Other reaction(s): Unknown 'feel really strange' no n/v    Levofloxacin    Omnicef [Cefdinir]    Trimethoprim Other (See Comments)   Erythromycin Diarrhea    Other reaction(s): Unknown Other reaction(s): Unknown   Sulfamethoxazole-Trimethoprim Diarrhea   Patient Measurements: Height: 5\' 3"  (160 cm) Weight: 70.7 kg (155 lb 13.8 oz) IBW/kg (Calculated) : 52.4 Heparin Dosing Weight: 67.1 kg   Vital Signs: Temp: 98.2 F (36.8 C) (10/13 0854) Temp Source: Oral (10/13 0854) BP: 111/79 (10/13 0854) Pulse Rate: 87 (10/13 0854)  Labs: Recent Labs    08/21/23 0856  HGB 15.1*  HCT 45.6  PLT 240  CREATININE 0.82  TROPONINIHS 5   Estimated Creatinine Clearance: 54.1 mL/min (by C-G formula based on SCr of 0.82 mg/dL).  Medical History: Past Medical History:  Diagnosis Date   Allergic genetic state    Arthritis    Cholelithiasis    Depression    Diarrhea    Fibroids    Hearing loss    Hemorrhoids    Hypothyroidism    IBS (irritable bowel syndrome)    Kidney stones    Osteoarthrosis    Plantar fasciitis    Seizure (HCC)    Stroke (HCC)    Synovitis    TIA (transient ischemic attack)    Medications:  No PTA anticoagulation  PTA ASA 81 mg   Assessment: [name] [name] is a 77 yo female that presented today with RUQ abdominal pain that radiates into her right side and up into her right shoulder. PMH is significant for cholelithiasis, kidney stones, hypothyroidism, seizures, and CVA. Reports CVA was about 10 months ago for which she was briefly on anticoagulation but currently only takes aspirin. CT of the abdomen/pelvis  shows a large pulmonary embolism with an area of pulmonary infarction. Pharmacy has been consulted for management of heparin infusion.   Goal of Therapy:  Heparin level 0.3-0.7 units/ml Monitor platelets by anticoagulation protocol: Yes   Plan:  Give 4500 unit bolus x 1  Start heparin infusion at 1200 units/hr  Check HL 8 hours after initiation and daily while on heparin  Monitor H/H and platelets  Littie Deeds, PharmD Pharmacy Resident  08/21/2023 12:18 PM

## 2023-08-21 NOTE — ED Notes (Addendum)
See triage note  Presents pain to RUQ which is radiating into right mid back  Pain increases with inspiration  States the pain started during the night  But eased up and then returned

## 2023-08-21 NOTE — Assessment & Plan Note (Addendum)
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

## 2023-08-21 NOTE — Consult Note (Signed)
[facility] [place]  ASSESSMENT / PLAN: 77 y.o. female with bilateral subsegmental pulmonary embolism. CT angiogram evidence of right heart strain. Echocardiogram pending. Continue heparin. NPO after midnight. Dr. Wyn Quaker or Dr. Gilda Crease will evaluate for possible thrombectomy tomorrow.  CHIEF COMPLAINT: right chest / upper quadrant pain  HISTORY OF PRESENT ILLNESS: [name] [name] is a 77 y.o. female who presents to [facility] ER for evaluation of right upper quadrant and right chest discomfort, worsened with deep inspiration. Workup initiated in [facility] ER shows bilateral subsegmental pulmonary embolism with some evidence of right heart strain. The patient does not have any dyspnea. She was previously on anticoagulation, but cannot remember why or what drug.   Past Medical History:  Diagnosis Date   Allergic genetic state    Arthritis    Cholelithiasis    Depression    Diarrhea    Fibroids    Hearing loss    Hemorrhoids    Hypothyroidism    IBS (irritable bowel syndrome)    Kidney stones    Osteoarthrosis    Plantar fasciitis    Seizure (HCC)    Stroke (HCC)    Synovitis    TIA (transient ischemic attack)     Past Surgical History:  Procedure Laterality Date   BREAST CYST ASPIRATION Left 12/14/2019   BUNIONECTOMY     COLONOSCOPY     COLONOSCOPY WITH PROPOFOL N/A 04/10/2018   Procedure: COLONOSCOPY WITH PROPOFOL;  Surgeon: Scot Jun, MD;  Location: [facility] ENDOSCOPY;  Service: Endoscopy;  Laterality: N/A;   COLONOSCOPY WITH PROPOFOL N/A 04/18/2023   Procedure: COLONOSCOPY WITH PROPOFOL;  Surgeon: Jaynie Collins, DO;  Location: [facility] ENDOSCOPY;  Service: Gastroenterology;  Laterality: N/A;   CYST EXCISION     mouth   DILATION AND CURETTAGE OF UTERUS     EXTRACORPOREAL SHOCK WAVE LITHOTRIPSY Left 03/29/2019   Procedure: EXTRACORPOREAL SHOCK WAVE LITHOTRIPSY (ESWL);  Surgeon: Sondra Come, MD;  Location: [facility] ORS;  Service: Urology;  Laterality: Left;    HARDWARE REMOVAL     JOINT REPLACEMENT     TONSILLECTOMY     WISDOM TOOTH EXTRACTION      Family History  Problem Relation Age of Onset   Breast cancer Maternal Grandmother        Late 8's   Cancer Mother    Kidney failure Father    Breast cancer Sister 60    Social History   Socioeconomic History   Marital status: Widowed    Spouse name: Not on file   Number of children: Not on file   Years of education: Not on file   Highest education level: Not on file  Occupational History   Not on file  Tobacco Use   Smoking status: Never   Smokeless tobacco: Never  Vaping Use   Vaping status: Never Used  Substance and Sexual Activity   Alcohol use: Yes    Alcohol/week: 1.0 standard drink of alcohol    Types: 1 Glasses of wine per week    Comment: rarely   Drug use: Never   Sexual activity: Yes  Other Topics Concern   Not on file  Social History Narrative   Not on file   Social Determinants of Health   Financial Resource Strain: Low Risk  (10/16/2022)   Received from [facility], [facility]   Overall Financial Resource Strain (CARDIA)    Difficulty of Paying Living Expenses: Not hard at all  Food Insecurity: No  Food Insecurity (10/16/2022)   Received from [facility], [facility]   Hunger Vital Sign    Worried About Running Out of Food in the Last Year: Never true    Ran Out of Food in the Last Year: Never true  Transportation Needs: No Transportation Needs (10/16/2022)   Received from [facility], [facility]   [facility] - Transportation    In the past 12 months, has lack of transportation kept you from medical appointments or from getting medications?: No    Lack of Transportation (Non-Medical): No  Physical Activity: Not on file  Stress: Not on file  Social Connections: Not on file  Intimate Partner Violence: Not on file    Allergies  Allergen Reactions    Prednisone Nausea And Vomiting    Other reaction(s): Vomiting    Augmentin [Amoxicillin-Pot Clavulanate] Nausea And Vomiting   Codeine Other (See Comments)    Other reaction(s): Unknown Other reaction(s): Unknown Other reaction(s): Unknown 'feel really strange' no n/v    Levofloxacin    Omnicef [Cefdinir]    Trimethoprim Other (See Comments)   Erythromycin Diarrhea    Other reaction(s): Unknown Other reaction(s): Unknown   Sulfamethoxazole-Trimethoprim Diarrhea    Current Facility-Administered Medications  Medication Dose Route Frequency Provider Last Rate Last Admin   atorvastatin (LIPITOR) tablet 40 mg  40 mg Oral Daily Floydene Flock, MD       heparin ADULT infusion 100 units/mL (25000 units/245mL)  1,200 Units/hr Intravenous Continuous Meegan, Eryn, RPH 12 mL/hr at 08/21/23 1308 1,200 Units/hr at 08/21/23 1308   levETIRAcetam (KEPPRA XR) 24 hr tablet 500 mg  500 mg Oral Daily Floydene Flock, MD       morphine (PF) 4 MG/ML injection 4 mg  4 mg Intravenous Q3H PRN Floydene Flock, MD       Current Outpatient Medications  Medication Sig Dispense Refill   EPINEPHrine 0.3 mg/0.3 mL IJ SOAJ injection Inject 0.3 mg into the muscle as needed for anaphylaxis.     ACACIA SYRUP PO 1.5 tsp per day     aspirin 81 MG EC tablet Take 81 mg by mouth at bedtime.      atorvastatin (LIPITOR) 40 MG tablet Take 40 mg by mouth daily.     Calcium Carbonate-Vit D-Min (CALCIUM 600+D PLUS MINERALS) 600-400 MG-UNIT TABS Take 1 tablet by mouth daily.     Cholecalciferol (VITAMIN D3) 50 MCG (2000 UT) capsule Take 2,000 Units by mouth in the morning and at bedtime.     Cyanocobalamin (VITAMIN B-12 CR) 1000 MCG TBCR Take 1 tablet by mouth daily.     levETIRAcetam (KEPPRA XR) 500 MG 24 hr tablet Take 500 mg by mouth daily.     meclizine (ANTIVERT) 12.5 MG tablet Take 1 tablet (12.5 mg total) by mouth 3 (three) times daily as needed for up to 9 doses for dizziness. 9 tablet 0   mometasone (NASONEX) 50  MCG/ACT nasal spray Place 2 sprays into the nose daily.     NONFORMULARY OR COMPOUNDED ITEM Inject into the muscle once a week. Compounded allergy shot.     zoledronic acid (RECLAST) 5 MG/100ML SOLN injection Inject into the vein. Every 12 months      PHYSICAL EXAM Vitals:   08/21/23 0854 08/21/23 0855 08/21/23 1312  BP: 111/79    Pulse: 87    Resp: 18    Temp: 98.2 F (36.8 C)  98.4 F (36.9 C)  TempSrc: Oral  Oral  SpO2: 98%    Weight:  70.7 kg   Height:  5\' 3"  (1.6 m)    Elderly woman in no distress Regular rate and rhythm Unlabored breathing  PERTINENT LABORATORY AND RADIOLOGIC DATA  Most recent CBC    Latest Ref Rng & Units 08/21/2023    8:56 AM 10/18/2022    9:09 AM 07/26/2022    6:20 PM  CBC  WBC 4.0 - 10.5 K/uL 10.7  7.3  7.5   Hemoglobin 12.0 - 15.0 g/dL 16.1  09.6  04.5   Hematocrit 36.0 - 46.0 % 45.6  48.6  45.2   Platelets 150 - 400 K/uL 240  328  297      Most recent CMP    Latest Ref Rng & Units 08/21/2023    8:56 AM 10/18/2022    9:09 AM 07/26/2022    6:20 PM  CMP  Glucose 70 - 99 mg/dL 409  811  914   BUN 8 - 23 mg/dL 14  18  21    Creatinine 0.44 - 1.00 mg/dL 7.82  9.56  2.13   Sodium 135 - 145 mmol/L 137  141  142   Potassium 3.5 - 5.1 mmol/L 3.7  4.2  3.9   Chloride 98 - 111 mmol/L 101  107  105   CO2 22 - 32 mmol/L 24  26  24    Calcium 8.9 - 10.3 mg/dL 8.7  9.0  9.0   Total Protein 6.5 - 8.1 g/dL 7.9  7.7    Total Bilirubin 0.3 - 1.2 mg/dL 1.4  0.8    Alkaline Phos 38 - 126 U/L 82  54    AST 15 - 41 U/L 32  32    ALT 0 - 44 U/L 26  22     CT angiogram shows subsegmental PE. The thrombus burden does not appear extreme. There is not obvious enlargement of the RV related to the LV, but radiology measurements suggest ratio of 1.22  Rande Brunt. Lenell Antu, MD FACS [facility] Phone Number: [phone] 08/21/2023 2:03 PM   Total time spent on preparing this encounter including chart review, data review,  collecting history, examining the patient, coordinating care for this new patient, 60 minutes.  Portions of this report may have been transcribed using voice recognition software.  Every effort has been made to ensure accuracy; however, inadvertent computerized transcription errors may still be present.

## 2023-08-21 NOTE — H&P (Addendum)
History and Physical    Patient: [name] [name] DOB: Nov 27, 1945 DOA: 08/21/2023 DOS: the patient was seen and examined on 08/21/2023 PCP: [name] Humphrey, MD  Patient coming from: Home  Chief Complaint:  Chief Complaint  Patient presents with   Abdominal Pain   HPI: [name] [name] is a 77 y.o. 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  Patient reports having generalized upper abdominal pain with radiation of the back for multiple weeks.  No fevers or chills.  No true chest pain.  No shortness of breath.  No nausea or vomiting.  Non-smoker.  Patient denies any known history of DVT or PE in the past.  No reported family history of thromboembolic events.  Noted history of CVA January 2024 in [place].  Was initially on ?anticoagulation (patient unsure of which 1).  Patient states medication was DC'd secondary to recurrent epistaxis.  Also had outpatient cardiology follow-up for question arrhythmia.  No formal diagnosis of atrial fibrillation on evaluation.  No vomiting or diarrhea.  No focal hemiparesis or confusion. No recent extended trips/prolonged travel.  Presented to the ER afebrile, hemodynamically stable.  White count 10.7, hemoglobin 15.1, platelets 240, creatinine 0.82.  LFTs within normal limits.  T. bili 1.4.  Troponin within normal limits.  CT abdomen pelvis with notable PE in the right lower lobe and trace right pleural effusion.  Also with cholelithiasis without evidence of cholecystitis.  Positive left renal atrophy.  CT of the chest showing PE with bilateral segmental pulmonary artery filling defects.  RV to LV 1.22.  Noted trace right pleural effusion as well as mild subpleural consolidation within the right lower lobe. Review of Systems: As mentioned in the history of present illness. All other systems reviewed and are negative. Past Medical History:  Diagnosis Date    Allergic genetic state    Arthritis    Cholelithiasis    Depression    Diarrhea    Fibroids    Hearing loss    Hemorrhoids    Hypothyroidism    IBS (irritable bowel syndrome)    Kidney stones    Osteoarthrosis    Plantar fasciitis    Seizure (HCC)    Stroke (HCC)    Synovitis    TIA (transient ischemic attack)    Past Surgical History:  Procedure Laterality Date   BREAST CYST ASPIRATION Left 12/14/2019   BUNIONECTOMY     COLONOSCOPY     COLONOSCOPY WITH PROPOFOL N/A 04/10/2018   Procedure: COLONOSCOPY WITH PROPOFOL;  Surgeon: Scot Jun, MD;  Location: [facility] ENDOSCOPY;  Service: Endoscopy;  Laterality: N/A;   COLONOSCOPY WITH PROPOFOL N/A 04/18/2023   Procedure: COLONOSCOPY WITH PROPOFOL;  Surgeon: Jaynie Collins, DO;  Location: [facility] ENDOSCOPY;  Service: Gastroenterology;  Laterality: N/A;   CYST EXCISION     mouth   DILATION AND CURETTAGE OF UTERUS     EXTRACORPOREAL SHOCK WAVE LITHOTRIPSY Left 03/29/2019   Procedure: EXTRACORPOREAL SHOCK WAVE LITHOTRIPSY (ESWL);  Surgeon: Sondra Come, MD;  Location: [facility] ORS;  Service: Urology;  Laterality: Left;   HARDWARE REMOVAL     JOINT REPLACEMENT     TONSILLECTOMY     WISDOM TOOTH EXTRACTION     Social History:  reports that she has never smoked. She has never used smokeless tobacco. She reports current alcohol use of about 1.0 standard drink of alcohol per week. She reports that she does not use drugs.  Allergies  Allergen Reactions   Prednisone Nausea And Vomiting    Other reaction(s): Vomiting    Augmentin [Amoxicillin-Pot Clavulanate] Nausea And Vomiting   Codeine Other (See Comments)    Other reaction(s): Unknown Other reaction(s): Unknown Other reaction(s): Unknown 'feel really strange' no n/v    Levofloxacin    Omnicef [Cefdinir]    Trimethoprim Other (See Comments)   Erythromycin Diarrhea    Other reaction(s): Unknown Other reaction(s): Unknown   Sulfamethoxazole-Trimethoprim Diarrhea     Family History  Problem Relation Age of Onset   Breast cancer Maternal Grandmother        Late 102's   Cancer Mother    Kidney failure Father    Breast cancer Sister 4    Prior to Admission medications   Medication Sig Start Date End Date Taking? Authorizing Provider  EPINEPHrine 0.3 mg/0.3 mL IJ SOAJ injection Inject 0.3 mg into the muscle as needed for anaphylaxis. 05/10/23  Yes [provider]  ACACIA SYRUP PO 1.5 tsp per day    [provider]  aspirin 81 MG EC tablet Take 81 mg by mouth at bedtime.     [provider]  atorvastatin (LIPITOR) 40 MG tablet Take 40 mg by mouth daily. 11/07/22   [provider]  Calcium Carbonate-Vit D-Min (CALCIUM 600+D PLUS MINERALS) 600-400 MG-UNIT TABS Take 1 tablet by mouth daily.    [provider]  Cholecalciferol (VITAMIN D3) 50 MCG (2000 UT) capsule Take 2,000 Units by mouth in the morning and at bedtime.    [provider]  Cyanocobalamin (VITAMIN B-12 CR) 1000 MCG TBCR Take 1 tablet by mouth daily.    [provider]  levETIRAcetam (KEPPRA XR) 500 MG 24 hr tablet Take 500 mg by mouth daily. 11/08/22 01/29/24  [provider]  meclizine (ANTIVERT) 12.5 MG tablet Take 1 tablet (12.5 mg total) by mouth 3 (three) times daily as needed for up to 9 doses for dizziness. 07/27/22   Irean Hong, MD  mometasone (NASONEX) 50 MCG/ACT nasal spray Place 2 sprays into the nose daily. 01/17/23   [provider]  NONFORMULARY OR COMPOUNDED ITEM Inject into the muscle once a week. Compounded allergy shot.    [provider]  zoledronic acid (RECLAST) 5 MG/100ML SOLN injection Inject into the vein. Every 12 months    [provider]    Physical Exam: Vitals:   08/21/23 0854 08/21/23 0855 08/21/23 1312  BP: 111/79    Pulse: 87    Resp: 18    Temp: 98.2 F (36.8 C)  98.4 F (36.9 C)  TempSrc: Oral  Oral  SpO2: 98%    Weight:  70.7 kg   Height:  5\' 3"  (1.6 m)     Physical Exam Constitutional:      Appearance: She is normal weight.  HENT:     Head: Normocephalic.     Nose: Nose normal.     Mouth/Throat:     Mouth: Mucous membranes are moist.  Eyes:     Pupils: Pupils are equal, round, and reactive to light.  Cardiovascular:     Rate and Rhythm: Normal rate.  Pulmonary:     Effort: Pulmonary effort is normal.  Abdominal:     General: Bowel sounds are normal.  Musculoskeletal:        General: Normal range of motion.     Cervical back: Normal range of motion.  Skin:    General: Skin is warm.  Neurological:  General: No focal deficit present.  Psychiatric:        Mood and Affect: Mood normal.     Data Reviewed:  There are no new results to review at this time.  CT Angio Chest PE W/Cm &/Or Wo Cm Addendum: ADDENDUM REPORT: 08/21/2023 11:27   ADDENDUM:  Critical Value/emergent results were called by telephone at the time  of interpretation on 08/21/2023 at 11:27 am to provider [facility]  , who verbally acknowledged these results.   Electronically Signed    By: Signa Kell M.D.    On: 08/21/2023 11:27 Narrative: CLINICAL DATA:  Evaluate for pulmonary embolism. High probability. Right-sided chest pain.  EXAM: CT ANGIOGRAPHY CHEST WITH CONTRAST  TECHNIQUE: Multidetector CT imaging of the chest was performed using the standard protocol during bolus administration of intravenous contrast. Multiplanar CT image reconstructions and MIPs were obtained to evaluate the vascular anatomy.  RADIATION DOSE REDUCTION: This exam was performed according to the departmental dose-optimization program which includes automated exposure control, adjustment of the mA and/or kV according to patient size and/or use of iterative reconstruction technique.  CONTRAST:  50mL OMNIPAQUE IOHEXOL 350 MG/ML SOLN  COMPARISON:  02/06/2013.  FINDINGS: Cardiovascular: Satisfactory opacification of the pulmonary arteries to the segmental level.  There are bilateral segmental pulmonary artery filling defects within the lower lobes compatible with acute pulmonary embolism, image 83/4 and image 71/4. The RV to LV ratio is equal to 1.22. Aortic atherosclerosis and coronary artery calcification. Normal heart size. No pericardial effusion.  Mediastinum/Nodes: No enlarged mediastinal, hilar, or axillary lymph nodes. Thyroid gland, trachea, and esophagus demonstrate no significant findings.  Lungs/Pleura: Subsegmental atelectasis and mild subpleural consolidation identified within the right base. Mild subsegmental atelectasis in the left lower lobe. Trace right pleural effusion. No suspicious pulmonary nodule or mass identified.  Upper Abdomen: No acute abnormality. Gallstones identified. Asymmetric left renal cortical atrophy.  Musculoskeletal: No chest wall abnormality. No acute or significant osseous findings.  Review of the MIP images confirms the above findings.  IMPRESSION: 1. Examination is positive for acute pulmonary embolism with bilateral segmental pulmonary artery filling defects within the lower lobes. RV to LV ratio is equal to 1.22. 2. Trace right pleural effusion with subsegmental atelectasis and mild subpleural consolidation within the right lower lobe. 3. Coronary artery calcification. 4. Cholelithiasis. 5. Aortic Atherosclerosis (ICD10-I70.0).  Electronically Signed: By: Signa Kell M.D. On: 08/21/2023 11:19 CT ABDOMEN PELVIS W CONTRAST CLINICAL DATA:  77 year old female with history of acute onset of nonlocalized abdominal pain.  EXAM: CT ABDOMEN AND PELVIS WITH CONTRAST  TECHNIQUE: Multidetector CT imaging of the abdomen and pelvis was performed using the standard protocol following bolus administration of intravenous contrast.  RADIATION DOSE REDUCTION: This exam was performed according to the departmental dose-optimization program which includes automated exposure control, adjustment of the  mA and/or kV according to patient size and/or use of iterative reconstruction technique.  CONTRAST:  75mL OMNIPAQUE IOHEXOL 300 MG/ML  SOLN  COMPARISON:  CT of the abdomen and pelvis 11/06/2020.  FINDINGS: Lower chest: In a right lower lobe pulmonary artery branch there is a nonocclusive filling defect (axial image 3 of series 2), indicative of pulmonary embolism. Trace right pleural effusion lying dependently. Areas of airspace consolidation and ground-glass attenuation in the right lower lobe concerning for pulmonary infarct with probable alveolar hemorrhage. Atherosclerotic calcifications are noted in the left circumflex and right coronary arteries.  Hepatobiliary: No suspicious cystic or solid hepatic lesions. Numerous partially calcified gallstones are noted lying dependently  in the gallbladder. Gallbladder is moderately distended. Gallbladder wall does not appear thickened or edematous. No intra or extrahepatic biliary ductal dilatation.  Pancreas: No pancreatic mass. No pancreatic ductal dilatation. No pancreatic or peripancreatic fluid collections or inflammatory changes.  Spleen: Unremarkable.  Adrenals/Urinary Tract: 11 mm low-attenuation lesion in the medial aspect of the interpolar region of the right kidney is compatible with a simple (Bosniak class 1) cyst which requires no imaging follow-up. Moderate atrophy of the left kidney. Bilateral adrenal glands are normal in appearance. No hydroureteronephrosis. Urinary bladder is unremarkable in appearance.  Stomach/Bowel: The appearance of the stomach is normal. No pathologic dilatation of small bowel or colon. The appendix is not confidently identified and may be surgically absent. Regardless, there are no inflammatory changes noted adjacent to the cecum to suggest the presence of an acute appendicitis at this time.  Vascular/Lymphatic: Atherosclerosis in the abdominal aorta and pelvic vasculature, without evidence  of aneurysm or dissection. No lymphadenopathy noted in the abdomen or pelvis.  Reproductive: Uterus is heterogeneous in appearance with calcified lesions likely to represent small fibroids, largest of which measures 2.1 cm. Left ovary is atrophic. 2.9 x 2.5 cm simple appearing cystic lesion in the right adnexa, similar to remote prior study from 11/06/2020, presumably a benign cyst (no imaging follow-up recommended).  Other: No significant volume of ascites.  No pneumoperitoneum.  Musculoskeletal: There are no aggressive appearing lytic or blastic lesions noted in the visualized portions of the skeleton.  IMPRESSION: 1. No acute findings are noted in the abdomen or pelvis to account for the patient's symptoms. 2. However, there is an incidental pulmonary embolism in the right lower lobe distribution with what appears to be pulmonary infarction and alveolar hemorrhage in the right lower lobe, along with a trace right pleural effusion. 3. Cholelithiasis without evidence of acute cholecystitis at this time. 4. Moderate left renal atrophy. 5. Additional incidental findings, as above.  Critical Value/emergent results were called by telephone at the time of interpretation on 08/21/2023 at 10:36 am to provider Greig Right, who verbally acknowledged these results.  Electronically Signed   By: Trudie Reed M.D.   On: 08/21/2023 10:36  Lab Results  Component Value Date   WBC 10.7 (H) 08/21/2023   HGB 15.1 (H) 08/21/2023   HCT 45.6 08/21/2023   MCV 94.4 08/21/2023   PLT 240 08/21/2023   Last metabolic panel Lab Results  Component Value Date   GLUCOSE 120 (H) 08/21/2023   NA 137 08/21/2023   K 3.7 08/21/2023   CL 101 08/21/2023   CO2 24 08/21/2023   BUN 14 08/21/2023   CREATININE 0.82 08/21/2023   GFRNONAA >60 08/21/2023   CALCIUM 8.7 (L) 08/21/2023   PROT 7.9 08/21/2023   ALBUMIN 4.3 08/21/2023   BILITOT 1.4 (H) 08/21/2023   ALKPHOS 82 08/21/2023   AST 32  08/21/2023   ALT 26 08/21/2023   ANIONGAP 12 08/21/2023    Assessment and Plan: * Pulmonary emboli (HCC) Recurrent right-sided abdominal pain with radiation of the back and chest CT imaging concerning for bilateral pulmonary embolism with segmental pulmonary artery filling defects within the lower lobes. RV to LV ratio 1.22 Hemodynamically stable at present Started on heparin drip in the ER-continue 2D echo Vascular surgery with Dr. Criss Alvine made aware of case Follow-up vascular surgery recommendations  Cholelithiasis without cholecystitis Reproducible right upper quadrant tenderness on exam with noted cholelithiasis without cholecystitis on CT scan Will check right upper quadrant ultrasound to correlate LFT stable T. bili  minimally elevated at 1.4 Surgical consult as clinically indicated  Seizure disorder [facility]) Post CVA associated seizure disorder per patient Continue Keppra  History of CVA (cerebrovascular accident) History of right posterior parietal region infarct, likely secondary to embolism, 11/08/2022  Anticoagulant discontinued in setting of recurrent epistaxis- No AF on [facility] cardiology followup  On aspirin No focal hemiparesis or deficits at present Monitor    Greater than 50% was spent in counseling and coordination of care with patient Total encounter time 80 minutes or more   Advance Care Planning:   Code Status: Full Code   Consults: Vascular Surgery   Family Communication: family at the bedside   Severity of Illness: The appropriate patient status for this patient is INPATIENT. Inpatient status is judged to be reasonable and necessary in order to provide the required intensity of service to ensure the patient's safety. The patient's presenting symptoms, physical exam findings, and initial radiographic and laboratory data in the context of their chronic comorbidities is felt to place them at high risk for further clinical deterioration. Furthermore, it is not  anticipated that the patient will be medically stable for discharge from the hospital within 2 midnights of admission.   * I certify that at the point of admission it is my clinical judgment that the patient will require inpatient hospital care spanning beyond 2 midnights from the point of admission due to high intensity of service, high risk for further deterioration and high frequency of surveillance required.*  Author: Floydene Flock, MD 08/21/2023 1:34 PM  For on call review [email or web address].

## 2023-08-21 NOTE — Assessment & Plan Note (Signed)
Reproducible right upper quadrant tenderness on exam with noted cholelithiasis without cholecystitis on CT scan Will check right upper quadrant ultrasound to correlate LFT stable T. bili minimally elevated at 1.4 Surgical consult as clinically indicated

## 2023-08-21 NOTE — Consult Note (Signed)
PHARMACY - ANTICOAGULATION CONSULT NOTE  Pharmacy Consult for Heparin  Indication: pulmonary embolus  Allergies  Allergen Reactions   Prednisone Nausea And Vomiting    Other reaction(s): Vomiting    Augmentin [Amoxicillin-Pot Clavulanate] Nausea And Vomiting   Codeine Other (See Comments)    Other reaction(s): Unknown Other reaction(s): Unknown Other reaction(s): Unknown 'feel really strange' no n/v    Levofloxacin    Omnicef [Cefdinir]    Trimethoprim Other (See Comments)   Erythromycin Diarrhea    Other reaction(s): Unknown Other reaction(s): Unknown   Sulfamethoxazole-Trimethoprim Diarrhea   Patient Measurements: Height: 5\' 3"  (160 cm) Weight: 70.7 kg (155 lb 13.8 oz) IBW/kg (Calculated) : 52.4 Heparin Dosing Weight: 67.1 kg   Vital Signs: Temp: 98.8 F (37.1 C) (10/13 1758) Temp Source: Oral (10/13 1742) BP: 126/58 (10/13 1758) Pulse Rate: 76 (10/13 1758)  Labs: Recent Labs    08/21/23 0856 08/21/23 1231 08/21/23 2058  HGB 15.1*  --   --   HCT 45.6  --   --   PLT 240  --   --   APTT  --  28  --   LABPROT  --  14.3  --   INR  --  1.1  --   HEPARINUNFRC  --   --  >1.10*  CREATININE 0.82  --   --   TROPONINIHS 5  --   --    Estimated Creatinine Clearance: 54.1 mL/min (by C-G formula based on SCr of 0.82 mg/dL).  Medical History: Past Medical History:  Diagnosis Date   Allergic genetic state    Arthritis    Cholelithiasis    Depression    Diarrhea    Fibroids    Hearing loss    Hemorrhoids    Hypothyroidism    IBS (irritable bowel syndrome)    Kidney stones    Osteoarthrosis    Plantar fasciitis    Seizure (HCC)    Stroke (HCC)    Synovitis    TIA (transient ischemic attack)    Medications:  No PTA anticoagulation  PTA ASA 81 mg   Assessment: [name] [name] is a 77 yo female that presented today with RUQ abdominal pain that radiates into her right side and up into her right shoulder. PMH is significant for cholelithiasis, kidney stones,  hypothyroidism, seizures, and CVA. Reports CVA was about 10 months ago for which she was briefly on anticoagulation but currently only takes aspirin. CT of the abdomen/pelvis shows a large pulmonary embolism with an area of pulmonary infarction. Pharmacy has been consulted for management of heparin infusion.   Goal of Therapy:  Heparin level 0.3-0.7 units/ml Monitor platelets by anticoagulation protocol: Yes   Plan:  10/13:  HL @ 2058 = > 1.10 - spoke with lab who confirmed HL was drawn from opposite arm as heparin infusion - will hold heparin drip for 1 hr and restart at 950 units/hr - will recheck HL 8 hrs after restart   Monitor H/H and platelets  Garnette Greb D, PharmD Pharmacy Resident  08/21/2023 10:06 PM

## 2023-08-21 NOTE — Progress Notes (Signed)
  Echocardiogram 2D Echocardiogram has been performed.  Lenor Coffin 08/21/2023, 2:54 PM

## 2023-08-21 NOTE — ED Triage Notes (Signed)
Pt here with abd pain since last night. Pt states pain is right lower and radiates to her back. Pt denies NVD. Pt states the pain has been constant since she woke up this morning.

## 2023-08-21 NOTE — ED Provider Notes (Signed)
----------------------------------------- 10:49 AM on 08/21/2023 -----------------------------------------  Assumed physician supervision of this case, working with Greig Right.  In short, Judi Jaffe Holzhauer is a 77 y.o. female with a chief complaint of RUQ pain.  Refer to the original H&P for additional details.  Patient received a CT of the abdomen and pelvis which I personally viewed and interpreted.  I see no evidence of infection or biliary/gallbladder disease.  However, the radiologist called and spoke both with Ms. Fisher and myself.  The patient has evidence of a large pulmonary embolism with an area of pulmonary infarction which accounts for her symptoms.  When I spoke with the radiologist, ask if there is benefit to obtaining a CTA chest to further assess the extent of the clot burden and to look for evidence of heart strain, and he heartily agreed that this would be beneficial.  I ordered a small fluid bolus of 500 mL normal saline to help with kidney function given the extra (but necessary) dose of IV contrast.  I personally assessed and examined the patient, updated her and her friend who is accompanying her as to the findings and the plan, and they understand and agree.  She remains hemodynamically stable with no hypoxia, tachycardia, nor tachypnea at this time.  She reports that her pain is adequately controlled at this time.  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  She has no history of DVT or PE and has not had any recent major surgeries, immobilizations, nor long trips.  She has not had any unilateral leg pain or swelling.   Clinical Course as of 08/21/23 1217  Sun Aug 21, 2023  1053 Of note, patient's labs are generally reassuring.  We added on an EKG and I added on a high-sensitivity troponin.  Her urinalysis is positive for UTI and I ordered a urine culture.  Given the extent of large  leukocytes and greater than 50 WBCs, I anticipate treating empirically. [CF]  1122 Troponin I (High Sensitivity): 5 [CF]  1137 Radiologist called me to discuss the case.  I also viewed and interpreted the CTA chest myself.  There is obvious area of infarction bilaterally with some small pleural effusions.  Radiologist reports bilateral subsegmental PEs with evidence of right heart strain. [CF]  1145 Updated patient.  She is still in a significant amount of pain and I ordered another 4 mg of IV morphine.  She still does not feel any dyspnea.  We talked about the results and given her ongoing pain, the right heart strain on the CT scan, her history of nosebleeds on anticoagulation, her pleural effusions, etc., I believe that the risk of sending her home and developing complication is too high.  I am consulting the hospitalist service for admission.  I will begin anticoagulation process, but I will first consult with the hospitalist to see if they would prefer heparin or enoxaparin so that we do not order one and then change the plan midway. [CF]  1213 Consulted with Dr. Alvester Morin with the hospitalist service who will admit.  He requested that I speak with vascular surgery to discuss the possibility of thrombectomy. I consulted Dr. Lenell Antu of vascular surgery.  We discussed the case and he will see the patient in the emergency department.  However given the stability of the patient, he feels it is appropriate to admit her on heparin and she will be seen again tomorrow by  vascular surgery to determine if she would benefit from thrombectomy at that point.  No need for emergent intervention. [CF]    Clinical Course User Index [CF] Loleta Rose, MD   .1-3 Lead EKG Interpretation  Performed by: Loleta Rose, MD Authorized by: Loleta Rose, MD     Interpretation: normal     ECG rate:  88   ECG rate assessment: normal     Rhythm: sinus rhythm     Ectopy: none     Conduction: normal   .Critical  Care  Performed by: Loleta Rose, MD Authorized by: Loleta Rose, MD   Critical care provider statement:    Critical care time (minutes):  40   Critical care time was exclusive of:  Separately billable procedures and treating other patients   Critical care was necessary to treat or prevent imminent or life-threatening deterioration of the following conditions:  Cardiac failure and circulatory failure   Critical care was time spent personally by me on the following activities:  Development of treatment plan with patient or surrogate, evaluation of patient's response to treatment, examination of patient, obtaining history from patient or surrogate, ordering and performing treatments and interventions, ordering and review of laboratory studies, ordering and review of radiographic studies, pulse oximetry, re-evaluation of patient's condition and review of old charts    Medications  morphine (PF) 4 MG/ML injection 4 mg (4 mg Intravenous Given 08/21/23 0937)  ondansetron (ZOFRAN) injection 4 mg (4 mg Intravenous Given 08/21/23 0933)  iohexol (OMNIPAQUE) 300 MG/ML solution 100 mL (75 mLs Intravenous Contrast Given 08/21/23 1006)  sodium chloride 0.9 % bolus 500 mL (0 mLs Intravenous Stopped 08/21/23 1150)  iohexol (OMNIPAQUE) 350 MG/ML injection 50 mL (50 mLs Intravenous Contrast Given 08/21/23 1052)  morphine (PF) 4 MG/ML injection 4 mg (4 mg Intravenous Given 08/21/23 1148)     ED Discharge Orders     None      Final diagnoses:  Urinary tract infection without hematuria, site unspecified  Acute pulmonary embolism with acute cor pulmonale, unspecified pulmonary embolism type (HCC)     Loleta Rose, MD 08/21/23 1217

## 2023-08-21 NOTE — Assessment & Plan Note (Signed)
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

## 2023-08-21 NOTE — Assessment & Plan Note (Signed)
Recurrent right-sided abdominal pain with radiation of the back and chest CT imaging concerning for bilateral pulmonary embolism with segmental pulmonary artery filling defects within the lower lobes. RV to LV ratio 1.22 Hemodynamically stable at present Started on heparin drip in the ER-continue 2D echo Vascular surgery with Dr. Criss Alvine made aware of case Follow-up vascular surgery recommendations

## 2023-08-21 NOTE — ED Notes (Signed)
US at bedside

## 2023-08-22 ENCOUNTER — Other Ambulatory Visit (HOSPITAL_COMMUNITY): Payer: Self-pay

## 2023-08-22 ENCOUNTER — Encounter
Admission: EM | Disposition: A | Discharge status: Home Health | Payer: Self-pay | Source: Home / Self Care | Attending: Internal Medicine

## 2023-08-22 DIAGNOSIS — I480 Paroxysmal atrial fibrillation: Secondary | ICD-10-CM | POA: Diagnosis not present

## 2023-08-22 DIAGNOSIS — I2699 Other pulmonary embolism without acute cor pulmonale: Secondary | ICD-10-CM | POA: Diagnosis not present

## 2023-08-22 HISTORY — PX: PULMONARY THROMBECTOMY: CATH118295

## 2023-08-22 LAB — URINE CULTURE: Culture: <10000 — AB

## 2023-08-22 LAB — COMPREHENSIVE METABOLIC PANEL
ALT: 19 U/L (ref 0–44)
AST: 25 U/L (ref 15–41)
Albumin: 3.4 g/dL — ABNORMAL LOW (ref 3.5–5.0)
Alkaline Phosphatase: 68 U/L (ref 38–126)
Anion gap: 10 (ref 5–15)
BUN: 15 mg/dL (ref 8–23)
CO2: 25 mmol/L (ref 22–32)
Calcium: 8 mg/dL — ABNORMAL LOW (ref 8.9–10.3)
Chloride: 102 mmol/L (ref 98–111)
Creatinine, Ser: 0.88 mg/dL (ref 0.44–1.00)
GFR, Estimated: >60 mL/min (ref >60)
Glucose, Bld: 106 mg/dL — ABNORMAL HIGH (ref 70–99)
Potassium: 4 mmol/L (ref 3.5–5.1)
Sodium: 137 mmol/L (ref 135–145)
Total Bilirubin: 1 mg/dL (ref 0.3–1.2)
Total Protein: 6.6 g/dL (ref 6.5–8.1)

## 2023-08-22 LAB — CBC
HCT: 40.6 % (ref 36.0–46.0)
Hemoglobin: 13.3 g/dL (ref 12.0–15.0)
MCH: 31.4 pg (ref 26.0–34.0)
MCHC: 32.8 g/dL (ref 30.0–36.0)
MCV: 96 fL (ref 80.0–100.0)
Platelets: 201 10*3/uL (ref 150–400)
RBC: 4.23 MIL/uL (ref 3.87–5.11)
RDW: 13.3 % (ref 11.5–15.5)
WBC: 9.6 10*3/uL (ref 4.0–10.5)
nRBC: 0 % (ref 0.0–0.2)

## 2023-08-22 LAB — HEPARIN LEVEL (UNFRACTIONATED): Heparin Unfractionated: 0.81 [IU]/mL — ABNORMAL HIGH (ref 0.30–0.70)

## 2023-08-22 SURGERY — PULMONARY THROMBECTOMY
Anesthesia: Moderate Sedation | Laterality: Bilateral

## 2023-08-22 MED ORDER — HYDROMORPHONE HCL 1 MG/ML IJ SOLN: 1.0000 mg | Freq: Once | INTRAMUSCULAR | Status: DC | PRN

## 2023-08-22 MED ORDER — IODIXANOL 320 MG/ML IV SOLN
INTRAVENOUS | Status: DC | PRN
  Administered 2023-08-22: 30 mL via INTRAVENOUS

## 2023-08-22 MED ORDER — DILTIAZEM HCL-DEXTROSE 125-5 MG/125ML-% IV SOLN (PREMIX)
5.0000 mg/h | INTRAVENOUS | Status: DC
  Filled 2023-08-22: qty 125

## 2023-08-22 MED ORDER — MIDAZOLAM HCL 5 MG/5ML IJ SOLN
INTRAMUSCULAR | Status: AC
  Filled 2023-08-22: qty 5

## 2023-08-22 MED ORDER — MIDAZOLAM HCL 2 MG/2ML IJ SOLN
INTRAMUSCULAR | Status: DC | PRN
  Administered 2023-08-22: 2 mg via INTRAVENOUS

## 2023-08-22 MED ORDER — MIDAZOLAM HCL 2 MG/ML PO SYRP
8.0000 mg | ORAL_SOLUTION | Freq: Once | ORAL | Status: AC | PRN
  Administered 2023-08-22: 8 mg via ORAL

## 2023-08-22 MED ORDER — FENTANYL CITRATE PF 50 MCG/ML IJ SOSY: 12.5000 ug | PREFILLED_SYRINGE | Freq: Once | INTRAMUSCULAR | Status: DC | PRN

## 2023-08-22 MED ORDER — DIPHENHYDRAMINE HCL 50 MG/ML IJ SOLN: 50.0000 mg | Freq: Once | INTRAMUSCULAR | Status: DC | PRN

## 2023-08-22 MED ORDER — CEFAZOLIN SODIUM-DEXTROSE 2-4 GM/100ML-% IV SOLN
INTRAVENOUS | Status: AC
  Filled 2023-08-22: qty 100

## 2023-08-22 MED ORDER — GATIFLOXACIN 0.5 % OP SOLN
1.0000 [drp] | Freq: Four times a day (QID) | OPHTHALMIC | Status: DC
  Filled 2023-08-22: qty 2.5

## 2023-08-22 MED ORDER — DILTIAZEM LOAD VIA INFUSION
10.0000 mg | Freq: Once | INTRAVENOUS | Status: DC
  Filled 2023-08-22: qty 10

## 2023-08-22 MED ORDER — LIDOCAINE-EPINEPHRINE (PF) 1 %-1:200000 IJ SOLN
INTRAMUSCULAR | Status: DC | PRN
  Administered 2023-08-22: 10 mL

## 2023-08-22 MED ORDER — FENTANYL CITRATE (PF) 100 MCG/2ML IJ SOLN
INTRAMUSCULAR | Status: AC
  Filled 2023-08-22: qty 2

## 2023-08-22 MED ORDER — METHYLPREDNISOLONE SODIUM SUCC 125 MG IJ SOLR: 125.0000 mg | Freq: Once | INTRAMUSCULAR | Status: DC | PRN

## 2023-08-22 MED ORDER — ONDANSETRON HCL 4 MG/2ML IJ SOLN: 4.0000 mg | Freq: Four times a day (QID) | INTRAMUSCULAR | Status: DC | PRN

## 2023-08-22 MED ORDER — HEPARIN (PORCINE) IN NACL 2000-0.9 UNIT/L-% IV SOLN
INTRAVENOUS | Status: DC | PRN
  Administered 2023-08-22: 1000 mL

## 2023-08-22 MED ORDER — HEPARIN SODIUM (PORCINE) 1000 UNIT/ML IJ SOLN
INTRAMUSCULAR | Status: AC
  Filled 2023-08-22: qty 10

## 2023-08-22 MED ORDER — PREDNISOLONE ACETATE 1 % OP SUSP
1.0000 [drp] | Freq: Four times a day (QID) | OPHTHALMIC | Status: DC
  Administered 2023-08-23 – 2023-08-24 (×6): 1 [drp] via OPHTHALMIC
  Filled 2023-08-22: qty 1

## 2023-08-22 MED ORDER — GATIFLOXACIN 0.5 % OP SOLN
1.0000 [drp] | Freq: Four times a day (QID) | OPHTHALMIC | Status: DC
  Administered 2023-08-23 – 2023-08-24 (×2): 1 [drp] via OPHTHALMIC
  Filled 2023-08-22: qty 2.5

## 2023-08-22 MED ORDER — SODIUM CHLORIDE 0.9 % IV SOLN: INTRAVENOUS | Status: DC

## 2023-08-22 MED ORDER — DILTIAZEM HCL 25 MG/5ML IV SOLN
INTRAVENOUS | Status: DC | PRN
  Administered 2023-08-22: 10 mg via INTRAVENOUS

## 2023-08-22 MED ORDER — KETOROLAC TROMETHAMINE 0.5 % OP SOLN
1.0000 [drp] | Freq: Four times a day (QID) | OPHTHALMIC | Status: DC
  Administered 2023-08-23 – 2023-08-24 (×6): 1 [drp] via OPHTHALMIC
  Filled 2023-08-22: qty 3

## 2023-08-22 MED ORDER — MIDAZOLAM HCL 2 MG/ML PO SYRP
ORAL_SOLUTION | ORAL | Status: AC
  Filled 2023-08-22: qty 5

## 2023-08-22 MED ORDER — CEFAZOLIN SODIUM-DEXTROSE 2-4 GM/100ML-% IV SOLN
2.0000 g | INTRAVENOUS | Status: DC
  Filled 2023-08-22: qty 100

## 2023-08-22 MED ORDER — HEPARIN SODIUM (PORCINE) 1000 UNIT/ML IJ SOLN
INTRAMUSCULAR | Status: DC | PRN
  Administered 2023-08-22: 3000 [IU] via INTRAVENOUS

## 2023-08-22 MED ORDER — HEPARIN (PORCINE) 25000 UT/250ML-% IV SOLN
850.0000 [IU]/h | INTRAVENOUS | Status: DC
  Administered 2023-08-22: 750 [IU]/h via INTRAVENOUS
  Filled 2023-08-22: qty 250

## 2023-08-22 MED ORDER — DILTIAZEM HCL ER COATED BEADS 120 MG PO CP24
120.0000 mg | ORAL_CAPSULE | Freq: Every day | ORAL | Status: DC
  Filled 2023-08-22: qty 1

## 2023-08-22 MED ORDER — FAMOTIDINE 20 MG PO TABS: 40.0000 mg | ORAL_TABLET | Freq: Once | ORAL | Status: DC | PRN

## 2023-08-22 MED ORDER — DILTIAZEM HCL 25 MG/5ML IV SOLN
INTRAVENOUS | Status: AC
  Filled 2023-08-22: qty 5

## 2023-08-22 SURGICAL SUPPLY — 17 items
CANISTER PENUMBRA ENGINE (MISCELLANEOUS) IMPLANT
CATH ANGIO 5F PIGTAIL 100CM (CATHETERS) IMPLANT
CATH INDIGO 12XTORQ 100 (CATHETERS) IMPLANT
CATH INDIGO SEP 12 (CATHETERS) IMPLANT
CATH LAUNCHER 6FR JR4 (CATHETERS) IMPLANT
CATH SELECT BERN TIP 5F 130 (CATHETERS) IMPLANT
CLOSURE PERCLOSE PROSTYLE (VASCULAR PRODUCTS) IMPLANT
COVER PROBE ULTRASOUND 5X96 (MISCELLANEOUS) IMPLANT
GLIDEWIRE ADV .035X180CM (WIRE) IMPLANT
PACK ANGIOGRAPHY (CUSTOM PROCEDURE TRAY) ×1 IMPLANT
SHEATH BRITE TIP 6FRX11 (SHEATH) IMPLANT
SHEATH CHCK-FLO 14FR 13 (SHEATH) IMPLANT
SUT MNCRL AB 4-0 PS2 18 (SUTURE) IMPLANT
SYR MEDRAD MARK 7 150ML (SYRINGE) IMPLANT
TUBING CONTRAST HIGH PRESS 72 (TUBING) IMPLANT
WIRE GUIDERIGHT .035X150 (WIRE) IMPLANT
WIRE SUPRACORE 300CM (WIRE) IMPLANT

## 2023-08-22 NOTE — Progress Notes (Signed)
Progress Note    [name] [name]  BZJ:[id number] DOB: Sep 28, 1946  DOA: 08/21/2023 PCP: Rayetta Humphrey, MD      Brief Narrative:    Medical records reviewed and are as summarized below:  [name] [name] is a 77 y.o. female with medical history significant for stroke in January 2024 on aspirin, Plavix discontinued because of epistaxis, depression, seizure disorder, who presented to the hospital with right-sided upper abdominal pain and back pain.  He did not report any shortness of breath or chest pain but she complained of pleuritic pain mainly on the right upper abdomen and backside.  No recent travel or surgeries.  She was found to have acute pulmonary embolism with RV to LV ratio of 1.22.Marland Kitchen       Assessment/Plan:   Principal Problem:   Pulmonary emboli (HCC) Active Problems:   Cholelithiasis without cholecystitis   History of CVA (cerebrovascular accident)   Seizure disorder (HCC)   Paroxysmal atrial fibrillation with RVR (HCC)    Body mass index is 27.61 kg/m.   Acute bilateral pulmonary embolism: S/p mechanical thrombectomy to the left lower lobe pulmonary artery and right lower lobe pulmonary artery on 08/22/2023.  Continue IV heparin drip and monitor heparin level per protocol.  Discussed anticoagulation with patient and Victorino Dike, daughter at the bedside.  She will likely be transition to Eliquis.  She understands that she is at increased risk of bleeding but she is hoping that this will not give her nosebleed like Plavix.  Follow-up with vascular surgeon.   Acute hypoxemic respiratory failure: Oxygen saturation dropped to 86%. Continue 2 L/min oxygen via [place] and wean off as able.   Paroxysmal atrial fibrillation with rapid ventricular response: She developed atrial fibrillation with rapid ventricular response while having mechanical thrombectomy today.  She spontaneously converted to normal sinus rhythm.  Plan to start oral Cardizem was aborted because of  hypotension.  Monitor heart rate on telemetry.   Seizure disorder: Continue Keppra   History of stroke: She was on aspirin prior to admission.  This will be discontinued at discharge since she will be on Eliquis for long-term anticoagulation. Of note, she is to take Plavix but this was discontinued in April 2024 because of epistaxis.   Other comorbidities include cholelithiasis, kidney stones, IBS, hypothyroidism, hemorrhoids, depression, arthritis   Diet Order             Diet regular Room service appropriate? Yes; Fluid consistency: Thin  Diet effective now                            Consultants: Vascular surgeon  Procedures: Mechanical thrombectomy    Medications:    atorvastatin  40 mg Oral Daily   levETIRAcetam  1,000 mg Oral Daily   Continuous Infusions:  heparin Stopped (08/22/23 1008)     Anti-infectives (From admission, onward)    Start     Dose/Rate Route Frequency Ordered Stop   08/22/23 0921  ceFAZolin (ANCEF) IVPB 2g/100 mL premix  Status:  Discontinued        2 g 200 mL/hr over 30 Minutes Intravenous 30 min pre-op 08/22/23 0921 08/22/23 1303              Family Communication/Anticipated D/C date and plan/Code Status   DVT prophylaxis:      Code Status: Full Code  Family Communication: Plan discussed with Victorino Dike, daughter, at the bedside Disposition Plan: Plan to discharge home  in 1 to 2 days   Status is: Inpatient Remains inpatient appropriate because: Acute pulmonary embolism       Subjective:   Interval events noted.  She complains of pain in the right upper quadrant and right side of her back.  Pain is pleuritic in nature.  No chest pain or shortness of breath.  Objective:    Vitals:   08/22/23 1255 08/22/23 1300 08/22/23 1315 08/22/23 1330  BP: (!) 107/58 (!) 102/56 (!) 107/59 97/67  Pulse: 77 74 76 81  Resp: 18 16 17 20   Temp:      TempSrc:      SpO2: 90% 92% 94% 93%  Weight:      Height:        No data found.   Intake/Output Summary (Last 24 hours) at 08/22/2023 1442 Last data filed at 08/22/2023 0721 Gross per 24 hour  Intake 318.72 ml  Output --  Net 318.72 ml   Filed Weights   08/21/23 0855 08/22/23 0953  Weight: 70.7 kg 70.7 kg    Exam:  GEN: NAD SKIN: Warm and dry EYES: No pallor or icterus ENT: MMM CV: RRR PULM: CTA B ABD: soft, ND, NT, +BS CNS: AAO x 3, non focal EXT: No edema or tenderness        Data Reviewed:   I have personally reviewed following labs and imaging studies:  Labs: Labs show the following:   Basic Metabolic Panel: Recent Labs  Lab 08/21/23 0856 08/22/23 0535  NA 137 137  K 3.7 4.0  CL 101 102  CO2 24 25  GLUCOSE 120* 106*  BUN 14 15  CREATININE 0.82 0.88  CALCIUM 8.7* 8.0*   GFR Estimated Creatinine Clearance: 50.5 mL/min (by C-G formula based on SCr of 0.88 mg/dL). Liver Function Tests: Recent Labs  Lab 08/21/23 0856 08/22/23 0535  AST 32 25  ALT 26 19  ALKPHOS 82 68  BILITOT 1.4* 1.0  PROT 7.9 6.6  ALBUMIN 4.3 3.4*   Recent Labs  Lab 08/21/23 0856  LIPASE 42   No results for input(s): "AMMONIA" in the last 168 hours. Coagulation profile Recent Labs  Lab 08/21/23 1231  INR 1.1    CBC: Recent Labs  Lab 08/21/23 0856 08/22/23 0535  WBC 10.7* 9.6  NEUTROABS 8.0*  --   HGB 15.1* 13.3  HCT 45.6 40.6  MCV 94.4 96.0  PLT 240 201   Cardiac Enzymes: No results for input(s): "CKTOTAL", "CKMB", "CKMBINDEX", "TROPONINI" in the last 168 hours. BNP (last 3 results) No results for input(s): "PROBNP" in the last 8760 hours. CBG: No results for input(s): "GLUCAP" in the last 168 hours. D-Dimer: No results for input(s): "DDIMER" in the last 72 hours. Hgb A1c: No results for input(s): "HGBA1C" in the last 72 hours. Lipid Profile: No results for input(s): "CHOL", "HDL", "LDLCALC", "TRIG", "CHOLHDL", "LDLDIRECT" in the last 72 hours. Thyroid function studies: No results for input(s): "TSH",  "T4TOTAL", "T3FREE", "THYROIDAB" in the last 72 hours.  Invalid input(s): "FREET3" Anemia work up: No results for input(s): "VITAMINB12", "FOLATE", "FERRITIN", "TIBC", "IRON", "RETICCTPCT" in the last 72 hours. Sepsis Labs: Recent Labs  Lab 08/21/23 0856 08/22/23 0535  WBC 10.7* 9.6    Microbiology Recent Results (from the past 240 hour(s))  Urine Culture     Status: Abnormal   Collection Time: 08/21/23  8:56 AM   Specimen: Urine, Clean Catch  Result Value Ref Range Status   Specimen Description   Final    URINE, CLEAN  CATCH Performed at [facility], [address], [place], [place] [address]    Special Requests   Final    NONE Performed at [facility], [address], [place], [place] [address]    Culture (A)  Final    <10,000 COLONIES/mL INSIGNIFICANT GROWTH Performed at [facility], [address] [place], [place] [address]    Report Status 08/22/2023 FINAL  Final    Procedures and diagnostic studies:  PERIPHERAL VASCULAR CATHETERIZATION  Result Date: 08/22/2023 See surgical note for result.  US Abdomen Limited RUQ (LIVER/GB)  Result Date: 08/21/2023 CLINICAL DATA:  Right upper quadrant abdominal pain radiating to the back since last night. History of gallstones. EXAM: ULTRASOUND ABDOMEN LIMITED RIGHT UPPER QUADRANT COMPARISON:  CT abdomen pelvis from same day. Right upper quadrant ultrasound dated May 19, 2023. FINDINGS: Gallbladder: Multiple gallstones again noted. No wall thickening visualized. No sonographic Murphy sign noted by sonographer. Common bile duct: Diameter: 4 mm, normal. Liver: No focal lesion identified. Within normal limits in parenchymal echogenicity. Portal vein is patent on color Doppler imaging with normal direction of blood flow towards the liver. Other: None. IMPRESSION: 1. Cholelithiasis without sonographic evidence of acute cholecystitis. Electronically Signed   By: Obie Dredge M.D.   On: 08/21/2023 15:38    ECHOCARDIOGRAM COMPLETE  Result Date: 08/21/2023    ECHOCARDIOGRAM REPORT   Patient Name:   [name] [name] Date of Exam: 08/21/2023 Medical Rec #:  [id number]       Height:       63.0 in Accession #:    [id number]      Weight:       155.9 lb Date of Birth:  17-Nov-1945      BSA:          1.739 m Patient Age:    76 years        BP:           111/79 mmHg Patient Gender: F               HR:           79 bpm. Exam Location:  [facility] Procedure: 2D Echo Indications:     Pulmonary Embolus I26.09  History:         Patient has no prior history of Echocardiogram examinations.  Sonographer:     Overton Mam RDCS, FASE Referring Phys:  [id number] Francoise Schaumann [place] Diagnosing Phys: Marcina Millard MD IMPRESSIONS  1. Left ventricular ejection fraction, by estimation, is 60 to 65%. The left ventricle has normal function. The left ventricle has no regional wall motion abnormalities. Left ventricular diastolic parameters are consistent with Grade I diastolic dysfunction (impaired relaxation).  2. Right ventricular systolic function is normal. The right ventricular size is normal.  3. The mitral valve is normal in structure. Trivial mitral valve regurgitation. No evidence of mitral stenosis.  4. The aortic valve is normal in structure. Aortic valve regurgitation is not visualized. No aortic stenosis is present.  5. The inferior vena cava is normal in size with greater than 50% respiratory variability, suggesting right atrial pressure of 3 mmHg. FINDINGS  Left Ventricle: Left ventricular ejection fraction, by estimation, is 60 to 65%. The left ventricle has normal function. The left ventricle has no regional wall motion abnormalities. The left ventricular internal cavity size was normal in size. There is  no left ventricular hypertrophy. Left ventricular diastolic parameters are consistent with Grade I diastolic dysfunction (impaired relaxation). Right Ventricle: The  right ventricular size is normal. No increase in right  ventricular wall thickness. Right ventricular systolic function is normal. Left Atrium: Left atrial size was normal in size. Right Atrium: Right atrial size was normal in size. Pericardium: There is no evidence of pericardial effusion. Mitral Valve: The mitral valve is normal in structure. Trivial mitral valve regurgitation. No evidence of mitral valve stenosis. Tricuspid Valve: The tricuspid valve is normal in structure. Tricuspid valve regurgitation is trivial. No evidence of tricuspid stenosis. Aortic Valve: The aortic valve is normal in structure. Aortic valve regurgitation is not visualized. No aortic stenosis is present. Aortic valve peak gradient measures 7.0 mmHg. Pulmonic Valve: The pulmonic valve was normal in structure. Pulmonic valve regurgitation is not visualized. No evidence of pulmonic stenosis. Aorta: The aortic root is normal in size and structure. Venous: The inferior vena cava is normal in size with greater than 50% respiratory variability, suggesting right atrial pressure of 3 mmHg. IAS/Shunts: No atrial level shunt detected by color flow Doppler.  LEFT VENTRICLE PLAX 2D LVIDd:         4.40 cm   Diastology LVIDs:         2.50 cm   LV e' medial:    6.20 cm/s LV PW:         0.90 cm   LV E/e' medial:  9.3 LV IVS:        1.00 cm   LV e' lateral:   8.70 cm/s LVOT diam:     1.80 cm   LV E/e' lateral: 6.6 LV SV:         40 LV SV Index:   23 LVOT Area:     2.54 cm  RIGHT VENTRICLE RV Basal diam:  2.20 cm RV S prime:     17.60 cm/s TAPSE (M-mode): 1.6 cm LEFT ATRIUM             Index        RIGHT ATRIUM          Index LA diam:        2.70 cm 1.55 cm/m   RA Area:     6.33 cm LA Vol (A2C):   25.3 ml 14.55 ml/m  RA Volume:   9.80 ml  5.63 ml/m LA Vol (A4C):   22.4 ml 12.88 ml/m LA Biplane Vol: 25.8 ml 14.83 ml/m  AORTIC VALVE AV Area (Vmax): 1.91 cm AV Vmax:        132.00 cm/s AV Peak Grad:   7.0 mmHg LVOT Vmax:      99.00 cm/s LVOT Vmean:     64.200 cm/s LVOT VTI:       0.157 m  AORTA Ao Root diam:  3.00 cm Ao Asc diam:  2.60 cm MITRAL VALVE MV Area (PHT): 3.08 cm    SHUNTS MV Decel Time: 246 msec    Systemic VTI:  0.16 m MV E velocity: 57.60 cm/s  Systemic Diam: 1.80 cm MV A velocity: 87.50 cm/s MV E/A ratio:  0.66 Marcina Millard MD Electronically signed by Marcina Millard MD Signature Date/Time: 08/21/2023/3:17:38 PM    Final    CT Angio Chest PE W/Cm &/Or Wo Cm  Addendum Date: 08/21/2023   ADDENDUM REPORT: 08/21/2023 11:27 ADDENDUM: Critical Value/emergent results were called by telephone at the time of interpretation on 08/21/2023 at 11:27 am to provider [facility] , who verbally acknowledged these results. Electronically Signed   By: Signa Kell M.D.   On: 08/21/2023 11:27   Result Date: 08/21/2023  CLINICAL DATA:  Evaluate for pulmonary embolism. High probability. Right-sided chest pain. EXAM: CT ANGIOGRAPHY CHEST WITH CONTRAST TECHNIQUE: Multidetector CT imaging of the chest was performed using the standard protocol during bolus administration of intravenous contrast. Multiplanar CT image reconstructions and MIPs were obtained to evaluate the vascular anatomy. RADIATION DOSE REDUCTION: This exam was performed according to the departmental dose-optimization program which includes automated exposure control, adjustment of the mA and/or kV according to patient size and/or use of iterative reconstruction technique. CONTRAST:  50mL OMNIPAQUE IOHEXOL 350 MG/ML SOLN COMPARISON:  02/06/2013. FINDINGS: Cardiovascular: Satisfactory opacification of the pulmonary arteries to the segmental level. There are bilateral segmental pulmonary artery filling defects within the lower lobes compatible with acute pulmonary embolism, image 83/4 and image 71/4. The RV to LV ratio is equal to 1.22. Aortic atherosclerosis and coronary artery calcification. Normal heart size. No pericardial effusion. Mediastinum/Nodes: No enlarged mediastinal, hilar, or axillary lymph nodes. Thyroid gland, trachea, and  esophagus demonstrate no significant findings. Lungs/Pleura: Subsegmental atelectasis and mild subpleural consolidation identified within the right base. Mild subsegmental atelectasis in the left lower lobe. Trace right pleural effusion. No suspicious pulmonary nodule or mass identified. Upper Abdomen: No acute abnormality. Gallstones identified. Asymmetric left renal cortical atrophy. Musculoskeletal: No chest wall abnormality. No acute or significant osseous findings. Review of the MIP images confirms the above findings. IMPRESSION: 1. Examination is positive for acute pulmonary embolism with bilateral segmental pulmonary artery filling defects within the lower lobes. RV to LV ratio is equal to 1.22. 2. Trace right pleural effusion with subsegmental atelectasis and mild subpleural consolidation within the right lower lobe. 3. Coronary artery calcification. 4. Cholelithiasis. 5. Aortic Atherosclerosis (ICD10-I70.0). Electronically Signed: By: Signa Kell M.D. On: 08/21/2023 11:19   CT ABDOMEN PELVIS W CONTRAST  Result Date: 08/21/2023 CLINICAL DATA:  77 year old female with history of acute onset of nonlocalized abdominal pain. EXAM: CT ABDOMEN AND PELVIS WITH CONTRAST TECHNIQUE: Multidetector CT imaging of the abdomen and pelvis was performed using the standard protocol following bolus administration of intravenous contrast. RADIATION DOSE REDUCTION: This exam was performed according to the departmental dose-optimization program which includes automated exposure control, adjustment of the mA and/or kV according to patient size and/or use of iterative reconstruction technique. CONTRAST:  75mL OMNIPAQUE IOHEXOL 300 MG/ML  SOLN COMPARISON:  CT of the abdomen and pelvis 11/06/2020. FINDINGS: Lower chest: In a right lower lobe pulmonary artery branch there is a nonocclusive filling defect (axial image 3 of series 2), indicative of pulmonary embolism. Trace right pleural effusion lying dependently. Areas of  airspace consolidation and ground-glass attenuation in the right lower lobe concerning for pulmonary infarct with probable alveolar hemorrhage. Atherosclerotic calcifications are noted in the left circumflex and right coronary arteries. Hepatobiliary: No suspicious cystic or solid hepatic lesions. Numerous partially calcified gallstones are noted lying dependently in the gallbladder. Gallbladder is moderately distended. Gallbladder wall does not appear thickened or edematous. No intra or extrahepatic biliary ductal dilatation. Pancreas: No pancreatic mass. No pancreatic ductal dilatation. No pancreatic or peripancreatic fluid collections or inflammatory changes. Spleen: Unremarkable. Adrenals/Urinary Tract: 11 mm low-attenuation lesion in the medial aspect of the interpolar region of the right kidney is compatible with a simple (Bosniak class 1) cyst which requires no imaging follow-up. Moderate atrophy of the left kidney. Bilateral adrenal glands are normal in appearance. No hydroureteronephrosis. Urinary bladder is unremarkable in appearance. Stomach/Bowel: The appearance of the stomach is normal. No pathologic dilatation of small bowel or colon. The appendix is not confidently identified  and may be surgically absent. Regardless, there are no inflammatory changes noted adjacent to the cecum to suggest the presence of an acute appendicitis at this time. Vascular/Lymphatic: Atherosclerosis in the abdominal aorta and pelvic vasculature, without evidence of aneurysm or dissection. No lymphadenopathy noted in the abdomen or pelvis. Reproductive: Uterus is heterogeneous in appearance with calcified lesions likely to represent small fibroids, largest of which measures 2.1 cm. Left ovary is atrophic. 2.9 x 2.5 cm simple appearing cystic lesion in the right adnexa, similar to remote prior study from 11/06/2020, presumably a benign cyst (no imaging follow-up recommended). Other: No significant volume of ascites.  No  pneumoperitoneum. Musculoskeletal: There are no aggressive appearing lytic or blastic lesions noted in the visualized portions of the skeleton. IMPRESSION: 1. No acute findings are noted in the abdomen or pelvis to account for the patient's symptoms. 2. However, there is an incidental pulmonary embolism in the right lower lobe distribution with what appears to be pulmonary infarction and alveolar hemorrhage in the right lower lobe, along with a trace right pleural effusion. 3. Cholelithiasis without evidence of acute cholecystitis at this time. 4. Moderate left renal atrophy. 5. Additional incidental findings, as above. Critical Value/emergent results were called by telephone at the time of interpretation on 08/21/2023 at 10:36 am to provider Greig Right, who verbally acknowledged these results. Electronically Signed   By: Trudie Reed M.D.   On: 08/21/2023 10:36               LOS: 1 day   Yelina Sarratt  [facility]   Pager on [email or web address]. If 7PM-7AM, please contact night-coverage at [email or web address]     08/22/2023, 2:42 PM

## 2023-08-22 NOTE — Plan of Care (Signed)

## 2023-08-22 NOTE — Consult Note (Signed)
PHARMACY - ANTICOAGULATION CONSULT NOTE  Pharmacy Consult for Heparin  Indication: pulmonary embolus  Allergies  Allergen Reactions   Prednisone Nausea And Vomiting    Other reaction(s): Vomiting    Augmentin [Amoxicillin-Pot Clavulanate] Nausea And Vomiting   Codeine Other (See Comments)    Other reaction(s): Unknown Other reaction(s): Unknown Other reaction(s): Unknown 'feel really strange' no n/v    Levofloxacin    Omnicef [Cefdinir]    Trimethoprim Other (See Comments)   Erythromycin Diarrhea    Other reaction(s): Unknown Other reaction(s): Unknown   Sulfamethoxazole-Trimethoprim Diarrhea   Patient Measurements: Height: 5\' 3"  (160 cm) Weight: 70.7 kg (155 lb 13.8 oz) IBW/kg (Calculated) : 52.4 Heparin Dosing Weight: 67.1 kg   Vital Signs: Temp: 99.4 F (37.4 C) (10/14 0808) BP: 122/51 (10/14 0808) Pulse Rate: 71 (10/14 0810)  Labs: Recent Labs    08/21/23 0856 08/21/23 1231 08/21/23 2058 08/22/23 0535 08/22/23 0700  HGB 15.1*  --   --  13.3  --   HCT 45.6  --   --  40.6  --   PLT 240  --   --  201  --   APTT  --  28  --   --   --   LABPROT  --  14.3  --   --   --   INR  --  1.1  --   --   --   HEPARINUNFRC  --   --  >1.10*  --  0.81*  CREATININE 0.82  --   --  0.88  --   TROPONINIHS 5  --   --   --   --    Estimated Creatinine Clearance: 50.5 mL/min (by C-G formula based on SCr of 0.88 mg/dL).  Medical History: Past Medical History:  Diagnosis Date   Allergic genetic state    Arthritis    Cholelithiasis    Depression    Diarrhea    Fibroids    Hearing loss    Hemorrhoids    Hypothyroidism    IBS (irritable bowel syndrome)    Kidney stones    Osteoarthrosis    Plantar fasciitis    Seizure (HCC)    Stroke (HCC)    Synovitis    TIA (transient ischemic attack)    Medications:  No PTA anticoagulation  PTA ASA 81 mg   Assessment: [name] [name] is a 77 yo female that presented today with RUQ abdominal pain that radiates into her right  side and up into her right shoulder. PMH is significant for cholelithiasis, kidney stones, hypothyroidism, seizures, and CVA. Reports CVA was about 10 months ago for which she was briefly on anticoagulation but currently only takes aspirin. CT of the abdomen/pelvis shows a large pulmonary embolism with an area of pulmonary infarction. Pharmacy has been consulted for management of heparin infusion. CBC stable. No DOAC PTA.   10/14 0700 0.81  Goal of Therapy:  Heparin level 0.3-0.7 units/ml Monitor platelets by anticoagulation protocol: Yes   Plan:  Heparin level is supratherapeutic.Will decrease heparin infusion at 750 units/hr and recheck heparin level in 8 hours. CBC daily while on heparin.   Ronnald Ramp, PharmD 08/22/2023 8:22 AM

## 2023-08-22 NOTE — Progress Notes (Signed)
OT Cancellation Note  Patient Details Name: [name] [name] MRN: [id number] DOB: 1946/03/14   Cancelled Treatment:    Reason Eval/Treat Not Completed: Medical issues which prohibited therapy;Patient at procedure or test/ unavailable. Per chart review pt diagnosed with PE with anticoagulation initiated 08/21/23 at 13:08. Pt currently off the floor for pulmonary thrombectomy. Will hold and initiate services as pt available.  Kathie Dike, M.S. OTR/L  08/22/23, 10:24 AM  ascom [phone]

## 2023-08-22 NOTE — Op Note (Signed)
[facility]  Percutaneous Study/Intervention Procedural Note   Date of Surgery: 08/22/2023,12:38 PM  Surgeon: Festus Barren  Pre-operative Diagnosis: Symptomatic bilateral pulmonary emboli  Post-operative diagnosis:  Same  Procedure(s) Performed:  1.  Contrast injection right heart  2.  Mechanical thrombectomy to the left lower lobe pulmonary artery and right lower lobe pulmonary artery using the penumbra CAT 12 catheter  4.  Selective catheter placement right lower lobe, middle lobe, and upper lobe pulmonary arteries  5.  Selective catheter placement left lower lobe and upper lobe pulmonary arteries    Anesthesia: Conscious sedation was administered under my direct supervision by the interventional radiology RN. IV Versed plus fentanyl were utilized. Continuous ECG, pulse oximetry and blood pressure was monitored throughout the entire procedure.  Versed was administered intravenously.  Conscious sedation was administered for a total of 51 minutes using 2 mg of Versed.  Patient desired not to have fentanyl.  EBL: 200 cc  Sheath: 14 French right femoral vein  Contrast: 40 cc   Fluoroscopy Time: 6.3 minutes  Indications:  Patient presents with pulmonary emboli. The patient is symptomatic with hypoxemia and dyspnea on exertion.  There is evidence of right heart strain on the CT angiogram. The patient is otherwise a good candidate for intervention and even the long-term benefits pulmonary angiography with thrombolysis is offered. The risks and benefits are reviewed long-term benefits are discussed. All questions are answered patient agrees to proceed.  Procedure:  Hayzel Ruberg Covertis a 77 y.o. female who was identified and appropriate procedural time out was performed.  The patient was then placed supine on the table and prepped and draped in the usual sterile fashion.  Ultrasound was used to evaluate the right common femoral vein.  It was patent, as it was echolucent  and compressible.  A digital ultrasound image was acquired for the permanent record.  A Seldinger needle was used to access the right common femoral vein under direct ultrasound guidance.  A 0.035 J wire was advanced without resistance and a 6Fr sheath was placed. A proglide device was placed in a preclose fashion and then upsized to a 14 [place] sheath.    The Advantage wire and pigtail catheter were then negotiated into the right atrium and bolus injection of contrast was utilized to demonstrate the right ventricle and the pulmonary artery outflow. The Advantage wire and catheter were then negotiated into the the pulmonary arteries.  The left pulmonary artery was cannulated and advanced into the left upper lobe and left lower lobe for selective imaging. This demonstrated no significant thrombus burden in the left upper lobe pulmonary artery with thrombus seen in the left lower lobe pulmonary artery.  I then transitioned to the right side with the advantage wire and catheter and first cannulated the right lower lobe and then the right middle and upper lobes.  This demonstrated thrombus in the right lower lobe pulmonary artery with no obvious thrombus seen in the middle or upper lobe pulmonary arteries.  3000 Units of heparin was then given and allowed to circulate.  The Penumbra Cat 12 catheter was then advanced up into the pulmonary vasculature. The left lung was addressed first. Catheter was negotiated into the left lower lobe pulmonary artery and mechanical thrombectomy was performed with the help of the separator. Follow-up imaging demonstrated a good result with decreased thrombus burden.  The Penumbra Cat 12 catheter was then negotiated to the opposite side. The right lung was then addressed. Catheter was negotiated into  the right lower lobe pulmonary artery and mechanical thrombectomy was performed with the separator. Follow-up imaging demonstrated a good result with decreased thrombus  burden.  After review these images wires were reintroduced and the catheter is removed. Then, the sheath is then pulled, the proglide device is secured, a Monocryl skin suture was placed and pressure is held. A sterile dressing is placed.    Findings:   Right heart imaging:  Right atrium and right ventricle and the pulmonary outflow tract appears fairly normal  Right lung:  This demonstrated thrombus in the right lower lobe pulmonary artery with no obvious thrombus seen in the middle or upper lobe pulmonary arteries.  Left lung:  This demonstrated no significant thrombus burden in the left upper lobe pulmonary artery with thrombus seen in the left lower lobe pulmonary artery.     Disposition: Patient was taken to the recovery room in stable condition having tolerated the procedure well.  Festus Barren 08/22/2023,12:38 PM

## 2023-08-22 NOTE — Plan of Care (Signed)

## 2023-08-22 NOTE — Progress Notes (Signed)
PT Cancellation Note  Patient Details Name: [name] [name] MRN: [id number] DOB: [birth year]   Cancelled Treatment:    Reason Eval/Treat Not Completed: Patient not medically ready: per chart review pt diagnosed with PE with anticoagulation initiated 08/21/23 at 13:08.  Per protocol PT evaluation to be held for 24 hrs from anticoagulation start time.  Will attempt to see pt at a future date/time as medically appropriate.     Ovidio Hanger PT, DPT 08/22/23, 8:44 AM

## 2023-08-22 NOTE — H&P (View-Only) (Signed)
Progress Note    08/22/2023 8:44 AM * No surgery date entered *  Subjective:  [name] [name] is a 77 yo female who presents to [facility] ER for evaluation of right upper quadrant and right chest discomfort, worsened with deep inspiration. Workup initiated in [facility] ER shows bilateral subsegmental pulmonary embolism with some evidence of right heart strain. The patient does have dyspnea this morning. She was previously on anticoagulation for her stroke which her daughter confirms was Plavix.  This was stopped back in April 2024.  On exam this morning patient is resting comfortably in bed but short of breath.  Oxygen saturation was taken with the nurse at the bedside this morning at 90% on room air.  Patient was then placed on 2 L nasal cannula oxygen.  Patient endorses lots of right chest pain on deep inspiration.  She also endorses severe shortness of breath while ambulating back and forth to the bathroom.  Vascular surgery was consulted to evaluate patient's pulmonary embolism.  Patient is currently on a heparin infusion.   Vitals:   08/22/23 0808 08/22/23 0810  BP: (!) 122/51   Pulse: 65 71  Resp: 18   Temp: 99.4 F (37.4 C)   SpO2: (!) 86% (!) 88%   Physical Exam: Cardiac:  RRR, Normal S1, S2. No murmurs appreciated.  Lungs: Clear on auscultation, slight rhonchi and wheezing in the right middle lobe. Incisions:  NONE Extremities: Palpable pulses in all extremities.  Noted left lower calf pain on palpation. Abdomen: Bowel sounds in all 4 quadrants present.  Soft, nontender and nondistended. Neurologic: History of CVA without residual.  AAOx4, answers all questions and follows commands appropriately.  CBC    Component Value Date/Time   WBC 9.6 08/22/2023 0535   RBC 4.23 08/22/2023 0535   HGB 13.3 08/22/2023 0535   HCT 40.6 08/22/2023 0535   PLT 201 08/22/2023 0535   MCV 96.0 08/22/2023 0535   MCH 31.4 08/22/2023 0535   MCHC 32.8 08/22/2023 0535   RDW 13.3 08/22/2023 0535    LYMPHSABS 1.4 08/21/2023 0856   MONOABS 1.0 08/21/2023 0856   EOSABS 0.1 08/21/2023 0856   BASOSABS 0.1 08/21/2023 0856    BMET    Component Value Date/Time   NA 137 08/22/2023 0535   K 4.0 08/22/2023 0535   CL 102 08/22/2023 0535   CO2 25 08/22/2023 0535   GLUCOSE 106 (H) 08/22/2023 0535   BUN 15 08/22/2023 0535   CREATININE 0.88 08/22/2023 0535   CALCIUM 8.0 (L) 08/22/2023 0535   GFRNONAA >60 08/22/2023 0535   GFRAA >60 02/03/2016 0934    INR    Component Value Date/Time   INR 1.1 08/21/2023 1231     Intake/Output Summary (Last 24 hours) at 08/22/2023 0844 Last data filed at 08/22/2023 0721 Gross per 24 hour  Intake 318.72 ml  Output --  Net 318.72 ml     Assessment/Plan:  77 y.o. female who presents to [facility] emergency department with increasing shortness of breath and chest pain on inspiration.  Upon workup patient was found to be positive for bilateral subsegmental pulmonary embolism.* No surgery date entered *   PLAN: Vascular surgery plans on taking the patient to the vascular lab this morning 08/22/2023 for a pulmonary thrombectomy.  I discussed in detail with the patient and her daughter at the bedside the procedure, benefits, risks, and complications.  Both verbalized her understanding.  Both would like to proceed with the procedure as soon as possible.  I answered all  their questions this morning.  Patient has been n.p.o. since midnight.  Patient currently on a heparin infusion.  I discussed the plan in detail with Dr. Festus Barren MD and he is in agreement with plan   DVT prophylaxis: Heparin infusion.   Marcie Bal [facility] 08/22/2023 8:44 AM

## 2023-08-22 NOTE — Progress Notes (Signed)
Pt. Renato Gails came by & saw pt. And her daughter Victorino Dike pre-procedure. Pt. Expressed gratitude for his conversation. Dr. Wyn Quaker also saw pt. Earlier & discussed procedure with pt. And her daughter.

## 2023-08-22 NOTE — Progress Notes (Signed)
Pt. In NSR upon arrival to [facility] recovery. Dr. Wyn Quaker & Dr. Myriam Forehand made aware. New orders received to start oral Cardizem , not IV cardiazem. Family at bedside & spoke with Dr. Wyn Quaker post procedure. Family verbalized understanding of conversation with Dr. Wyn Quaker, including bout of RAF in vascular lab.

## 2023-08-22 NOTE — Interval H&P Note (Signed)
History and Physical Interval Note:  08/22/2023 10:03 AM  [name] [name]  has presented today for surgery, with the diagnosis of Pulmonary Embolism.  The various methods of treatment have been discussed with the patient and family. After consideration of risks, benefits and other options for treatment, the patient has consented to  Procedure(s): PULMONARY THROMBECTOMY (Bilateral) as a surgical intervention.  The patient's history has been reviewed, patient examined, no change in status, stable for surgery.  I have reviewed the patient's chart and labs.  Questions were answered to the patient's satisfaction.     Festus Barren

## 2023-08-22 NOTE — TOC Benefit Eligibility Note (Signed)
Patient Product/process development scientist completed.    The patient is insured through [employer]. Patient has Medicare and is not eligible for a copay card, but may be able to apply for patient assistance, if available.    Ran test claim for Eliquis 5 mg and the current 30 day co-pay is $40.00.  Ran test claim for Xarelto 20 mg and the current 30 day co-pay is $40.00.  This test claim was processed through [facility]- copay amounts may vary at other pharmacies due to pharmacy/plan contracts, or as the patient moves through the different stages of their insurance plan.     Roland Earl, CPHT Pharmacy Technician III Certified Patient Advocate [facility] Patient Advocate Team Direct Number: [phone]  Fax: [phone]

## 2023-08-22 NOTE — Progress Notes (Signed)
Progress Note    08/22/2023 8:44 AM * No surgery date entered *  Subjective:  [name] [name] is a 77 yo female who presents to [facility] ER for evaluation of right upper quadrant and right chest discomfort, worsened with deep inspiration. Workup initiated in [facility] ER shows bilateral subsegmental pulmonary embolism with some evidence of right heart strain. The patient does have dyspnea this morning. She was previously on anticoagulation for her stroke which her daughter confirms was Plavix.  This was stopped back in April 2024.  On exam this morning patient is resting comfortably in bed but short of breath.  Oxygen saturation was taken with the nurse at the bedside this morning at 90% on room air.  Patient was then placed on 2 L nasal cannula oxygen.  Patient endorses lots of right chest pain on deep inspiration.  She also endorses severe shortness of breath while ambulating back and forth to the bathroom.  Vascular surgery was consulted to evaluate patient's pulmonary embolism.  Patient is currently on a heparin infusion.   Vitals:   08/22/23 0808 08/22/23 0810  BP: (!) 122/51   Pulse: 65 71  Resp: 18   Temp: 99.4 F (37.4 C)   SpO2: (!) 86% (!) 88%   Physical Exam: Cardiac:  RRR, Normal S1, S2. No murmurs appreciated.  Lungs: Clear on auscultation, slight rhonchi and wheezing in the right middle lobe. Incisions:  NONE Extremities: Palpable pulses in all extremities.  Noted left lower calf pain on palpation. Abdomen: Bowel sounds in all 4 quadrants present.  Soft, nontender and nondistended. Neurologic: History of CVA without residual.  AAOx4, answers all questions and follows commands appropriately.  CBC    Component Value Date/Time   WBC 9.6 08/22/2023 0535   RBC 4.23 08/22/2023 0535   HGB 13.3 08/22/2023 0535   HCT 40.6 08/22/2023 0535   PLT 201 08/22/2023 0535   MCV 96.0 08/22/2023 0535   MCH 31.4 08/22/2023 0535   MCHC 32.8 08/22/2023 0535   RDW 13.3 08/22/2023 0535    LYMPHSABS 1.4 08/21/2023 0856   MONOABS 1.0 08/21/2023 0856   EOSABS 0.1 08/21/2023 0856   BASOSABS 0.1 08/21/2023 0856    BMET    Component Value Date/Time   NA 137 08/22/2023 0535   K 4.0 08/22/2023 0535   CL 102 08/22/2023 0535   CO2 25 08/22/2023 0535   GLUCOSE 106 (H) 08/22/2023 0535   BUN 15 08/22/2023 0535   CREATININE 0.88 08/22/2023 0535   CALCIUM 8.0 (L) 08/22/2023 0535   GFRNONAA >60 08/22/2023 0535   GFRAA >60 02/03/2016 0934    INR    Component Value Date/Time   INR 1.1 08/21/2023 1231     Intake/Output Summary (Last 24 hours) at 08/22/2023 0844 Last data filed at 08/22/2023 0721 Gross per 24 hour  Intake 318.72 ml  Output --  Net 318.72 ml     Assessment/Plan:  77 y.o. female who presents to [facility] emergency department with increasing shortness of breath and chest pain on inspiration.  Upon workup patient was found to be positive for bilateral subsegmental pulmonary embolism.* No surgery date entered *   PLAN: Vascular surgery plans on taking the patient to the vascular lab this morning 08/22/2023 for a pulmonary thrombectomy.  I discussed in detail with the patient and her daughter at the bedside the procedure, benefits, risks, and complications.  Both verbalized her understanding.  Both would like to proceed with the procedure as soon as possible.  I answered all  their questions this morning.  Patient has been n.p.o. since midnight.  Patient currently on a heparin infusion.  I discussed the plan in detail with Dr. Festus Barren MD and he is in agreement with plan   DVT prophylaxis: Heparin infusion.   Marcie Bal [facility] 08/22/2023 8:44 AM

## 2023-08-23 ENCOUNTER — Encounter: Payer: Self-pay | Admitting: Vascular Surgery

## 2023-08-23 ENCOUNTER — Inpatient Hospital Stay: Payer: Medicare PPO

## 2023-08-23 DIAGNOSIS — I480 Paroxysmal atrial fibrillation: Secondary | ICD-10-CM | POA: Diagnosis not present

## 2023-08-23 DIAGNOSIS — I2699 Other pulmonary embolism without acute cor pulmonale: Secondary | ICD-10-CM | POA: Diagnosis not present

## 2023-08-23 LAB — CBC
HCT: 34.9 % — ABNORMAL LOW (ref 36.0–46.0)
Hemoglobin: 11.6 g/dL — ABNORMAL LOW (ref 12.0–15.0)
MCH: 31.4 pg (ref 26.0–34.0)
MCHC: 33.2 g/dL (ref 30.0–36.0)
MCV: 94.3 fL (ref 80.0–100.0)
Platelets: 174 10*3/uL (ref 150–400)
RBC: 3.7 MIL/uL — ABNORMAL LOW (ref 3.87–5.11)
RDW: 13 % (ref 11.5–15.5)
WBC: 10.1 10*3/uL (ref 4.0–10.5)
nRBC: 0 % (ref 0.0–0.2)

## 2023-08-23 LAB — HEPARIN LEVEL (UNFRACTIONATED)
Heparin Unfractionated: 0.25 [IU]/mL — ABNORMAL LOW (ref 0.30–0.70)
Heparin Unfractionated: 0.57 [IU]/mL (ref 0.30–0.70)

## 2023-08-23 MED ORDER — APIXABAN 5 MG PO TABS: 5.0000 mg | ORAL_TABLET | Freq: Two times a day (BID) | ORAL | Status: DC

## 2023-08-23 MED ORDER — APIXABAN 5 MG PO TABS
10.0000 mg | ORAL_TABLET | Freq: Two times a day (BID) | ORAL | Status: DC
  Administered 2023-08-23 – 2023-08-24 (×3): 10 mg via ORAL
  Filled 2023-08-23 (×3): qty 2

## 2023-08-23 MED ORDER — HEPARIN BOLUS VIA INFUSION
1000.0000 [IU] | Freq: Once | INTRAVENOUS | Status: AC
  Administered 2023-08-23: 1000 [IU] via INTRAVENOUS
  Filled 2023-08-23: qty 1000

## 2023-08-23 NOTE — Plan of Care (Signed)

## 2023-08-23 NOTE — Plan of Care (Signed)
  Problem: Education: Goal: Knowledge of General Education information will improve Description: Including pain rating scale, medication(s)/side effects and non-pharmacologic comfort measures Outcome: Progressing   Problem: Clinical Measurements: Goal: Will remain free from infection Outcome: Progressing Goal: Respiratory complications will improve Outcome: Progressing   Problem: Activity: Goal: Risk for activity intolerance will decrease Outcome: Progressing   Problem: Elimination: Goal: Will not experience complications related to urinary retention Outcome: Progressing

## 2023-08-23 NOTE — Progress Notes (Signed)
  Progress Note    08/23/2023 10:20 AM 1 Day Post-Op  Subjective:  [name] [name] is a 77 yo female no POD #1 from pulmonary thrombectomy. Patient rests comfortably in bed this morning. She continues to require 2 liters of nasal cannula oxygen. She reports she has not ambulated this morning, only to the bedside commode. She endorse still having right sided chest pain on deep inspiration. Patient continues on heparin infusion. Denies any incision site pain. No other complaints overnight and vitals all remain stable.    Vitals:   08/23/23 0436 08/23/23 0758  BP: (!) 122/51 (!) 125/41  Pulse: 64 69  Resp: 18 18  Temp: 98.5 F (36.9 C) 99.1 F (37.3 C)  SpO2: 97% 93%   Physical Exam: Cardiac:  RRR, Normal S1, S2. No murmurs appreciated.  Lungs: Clear on auscultation, slight rhonchi and wheezing in the right middle lobe. Incisions:  NONE Extremities: Palpable pulses in all extremities.  Noted left lower calf pain on palpation. Abdomen: Bowel sounds in all 4 quadrants present.  Soft, nontender and nondistended. Neurologic: History of CVA without residual.  AAOx4, answers all questions and follows commands appropriately.  CBC    Component Value Date/Time   WBC 10.1 08/23/2023 0423   RBC 3.70 (L) 08/23/2023 0423   HGB 11.6 (L) 08/23/2023 0423   HCT 34.9 (L) 08/23/2023 0423   PLT 174 08/23/2023 0423   MCV 94.3 08/23/2023 0423   MCH 31.4 08/23/2023 0423   MCHC 33.2 08/23/2023 0423   RDW 13.0 08/23/2023 0423   LYMPHSABS 1.4 08/21/2023 0856   MONOABS 1.0 08/21/2023 0856   EOSABS 0.1 08/21/2023 0856   BASOSABS 0.1 08/21/2023 0856    BMET    Component Value Date/Time   NA 137 08/22/2023 0535   K 4.0 08/22/2023 0535   CL 102 08/22/2023 0535   CO2 25 08/22/2023 0535   GLUCOSE 106 (H) 08/22/2023 0535   BUN 15 08/22/2023 0535   CREATININE 0.88 08/22/2023 0535   CALCIUM 8.0 (L) 08/22/2023 0535   GFRNONAA >60 08/22/2023 0535   GFRAA >60 02/03/2016 0934    INR    Component  Value Date/Time   INR 1.1 08/21/2023 1231     Intake/Output Summary (Last 24 hours) at 08/23/2023 1020 Last data filed at 08/22/2023 1826 Gross per 24 hour  Intake 502.5 ml  Output --  Net 502.5 ml     Assessment/Plan:  77 y.o. female is s/p pulmonary thrombectomy 1 Day Post-Op   PLAN: Patient to be converted off a Heparin infusion to oral anticoagulation. Recommend eliquis 10 mg twice daily for 7 days then change to 5 mg twice daily indefinitely.  Okay for patient to discharge home when deemed medically ready.    DVT prophylaxis:  Heparin Infusion    Marcie Bal [facility] 08/23/2023 10:20 AM

## 2023-08-23 NOTE — Evaluation (Signed)
Physical Therapy Evaluation Patient Details Name: [name] [name] MRN: [id number] DOB: [birth year] Today's Date: 08/23/2023  History of Present Illness  Patient is a 77 y.o. 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 Current MD assessment: PE now s/p pulmonary thrombectomy.  Clinical Impression  Pt was pleasant and motivated to participate during the session and put forth good effort throughout. Discussed with MD; approved pt for PT after Eliquis, pt had first dose prior to session. Pt is is Mod I for bed mobility and transfers, CGA for ambulation with RW. Pt SpO2 monitored throughout session, Initially reading 95% on 2.5L, trail of room air monitored throughout graded ambulatory bouts, SpO2 remained above 89% throughout session, often within 92-94%. Initial bout of 30 feet with RW pt reported as "medium" difficulty. Pt able to perform additional bout of 50 feet, pt reports this as her approximate max comfortable amb distance and declined further amb bouts, pt was noting feeling of fatigue as well. Pt will benefit from continued PT services upon discharge to safely address deficits listed in patient problem list for decreased caregiver assistance and eventual return to PLOF.       If plan is discharge home, recommend the following: A little help with walking and/or transfers;A little help with bathing/dressing/bathroom;Assist for transportation;Help with stairs or ramp for entrance   Can travel by private vehicle        Equipment Recommendations None recommended by PT  Recommendations for Other Services       Functional Status Assessment Patient has had a recent decline in their functional status and demonstrates the ability to make significant improvements in function in a reasonable and predictable amount of time.     Precautions / Restrictions Precautions Precautions:  Fall Restrictions Weight Bearing Restrictions: No      Mobility  Bed Mobility Overal bed mobility: Modified Independent             General bed mobility comments: increased time    Transfers Overall transfer level: Modified independent Equipment used: Rolling walker (2 wheels)               General transfer comment: increased time, no cues needed for hand placement or sequencing    Ambulation/Gait Ambulation/Gait assistance: Contact guard assist Gait Distance (Feet): 30 Feet x1, 50 feet x1 Assistive device: Rolling walker (2 wheels) Gait Pattern/deviations: Decreased step length - right, Decreased step length - left, Decreased stride length Gait velocity: decreased     General Gait Details: Slow but steady steps, no imbalance. pt able to perform x2 graded ambulatory bouts  Stairs            Wheelchair Mobility     Tilt Bed    Modified Rankin (Stroke Patients Only)       Balance Overall balance assessment: No apparent balance deficits (not formally assessed)                                           Pertinent Vitals/Pain Pain Assessment Pain Assessment: Faces Faces Pain Scale: Hurts a little bit Pain Location: LE's Pain Descriptors / Indicators: Sore Pain Intervention(s): Monitored during session    Home Living Family/patient expects to be discharged to:: Private residence Living Arrangements: Alone Available Help at Discharge: Family;Available 24 hours/day Type of Home: House Home Access: Level entry (pt reports  having small threshold)       Home Layout: One level Home Equipment: Cane - single point;Rollator (4 wheels);Rolling Walker (2 wheels);Shower seat - built in;Grab bars - tub/shower      Prior Function Prior Level of Function : Independent/Modified Independent             Mobility Comments: Currently going to OP PT, full commmunity amb. No fall hx ADLs Comments: Ind     Extremity/Trunk Assessment    Upper Extremity Assessment Upper Extremity Assessment: Generalized weakness    Lower Extremity Assessment Lower Extremity Assessment: Generalized weakness       Communication   Communication Communication: No apparent difficulties  Cognition Arousal: Alert Behavior During Therapy: WFL for tasks assessed/performed Overall Cognitive Status: Within Functional Limits for tasks assessed                                          General Comments      Exercises     Assessment/Plan    PT Assessment Patient needs continued PT services  PT Problem List Decreased strength;Decreased range of motion;Decreased activity tolerance;Decreased balance;Decreased mobility;Decreased coordination       PT Treatment Interventions DME instruction;Balance training;Gait training;Stair training;Functional mobility training;Therapeutic activities;Therapeutic exercise    PT Goals (Current goals can be found in the Care Plan section)  Acute Rehab PT Goals Patient Stated Goal: get stronger PT Goal Formulation: With patient Time For Goal Achievement: 09/05/23 Potential to Achieve Goals: Good    Frequency Min 1X/week     Co-evaluation               AM-PAC PT "6 Clicks" Mobility  Outcome Measure Help needed turning from your back to your side while in a flat bed without using bedrails?: None Help needed moving from lying on your back to sitting on the side of a flat bed without using bedrails?: None Help needed moving to and from a bed to a chair (including a wheelchair)?: A Little Help needed standing up from a chair using your arms (e.g., wheelchair or bedside chair)?: A Little Help needed to walk in hospital room?: A Little Help needed climbing 3-5 steps with a railing? : A Little 6 Click Score: 20    End of Session Equipment Utilized During Treatment: Gait belt Activity Tolerance: Patient tolerated treatment well Patient left: in bed;with family/visitor  present;with bed alarm set;with call bell/phone within reach Nurse Communication: Mobility status PT Visit Diagnosis: Other abnormalities of gait and mobility (R26.89);Difficulty in walking, not elsewhere classified (R26.2)    Time: 1610-9604 PT Time Calculation (min) (ACUTE ONLY): 26 min   Charges:                 Cecile Sheerer, SPT 08/23/23, 4:09 PM

## 2023-08-23 NOTE — Progress Notes (Signed)
OT Cancellation Note  Patient Details Name: [name] [name] MRN: [id number] DOB: Jan 01, 1946   Cancelled Treatment:    Reason Eval/Treat Not Completed: Patient declined, no reason specified. Chart reviewed, upon arrival pt reclined in bed, RN in room having just provided pain medicine. Pt defers further mobility assessment at this time, will re-attempt next date as able.   Kathie Dike, M.S. OTR/L  08/23/23, 3:20 PM  ascom [phone]

## 2023-08-23 NOTE — Consult Note (Addendum)
PHARMACY - ANTICOAGULATION CONSULT NOTE  Pharmacy Consult for Heparin  Indication: pulmonary embolus  Allergies  Allergen Reactions   Prednisone Nausea And Vomiting    Other reaction(s): Vomiting    Augmentin [Amoxicillin-Pot Clavulanate] Nausea And Vomiting   Codeine Other (See Comments)    Other reaction(s): Unknown Other reaction(s): Unknown Other reaction(s): Unknown 'feel really strange' no n/v    Levofloxacin    Omnicef [Cefdinir]    Trimethoprim Other (See Comments)   Erythromycin Diarrhea    Other reaction(s): Unknown Other reaction(s): Unknown   Sulfamethoxazole-Trimethoprim Diarrhea   Patient Measurements: Height: 5\' 3"  (160 cm) Weight: 66.4 kg (146 lb 6.2 oz) IBW/kg (Calculated) : 52.4 Heparin Dosing Weight: 67.1 kg   Vital Signs: Temp: 99.1 F (37.3 C) (10/15 0758) BP: 125/41 (10/15 0758) Pulse Rate: 69 (10/15 0758)  Labs: Recent Labs    08/21/23 0856 08/21/23 1231 08/21/23 2058 08/22/23 0535 08/22/23 0700 08/22/23 2352 08/23/23 0423 08/23/23 0909  HGB 15.1*  --   --  13.3  --   --  11.6*  --   HCT 45.6  --   --  40.6  --   --  34.9*  --   PLT 240  --   --  201  --   --  174  --   APTT  --  28  --   --   --   --   --   --   LABPROT  --  14.3  --   --   --   --   --   --   INR  --  1.1  --   --   --   --   --   --   HEPARINUNFRC  --   --    < >  --  0.81* 0.25*  --  0.57  CREATININE 0.82  --   --  0.88  --   --   --   --   TROPONINIHS 5  --   --   --   --   --   --   --    < > = values in this interval not displayed.   Estimated Creatinine Clearance: 49 mL/min (by C-G formula based on SCr of 0.88 mg/dL).  Medical History: Past Medical History:  Diagnosis Date   Allergic genetic state    Arthritis    Cholelithiasis    Depression    Diarrhea    Fibroids    Hearing loss    Hemorrhoids    Hypothyroidism    IBS (irritable bowel syndrome)    Kidney stones    Osteoarthrosis    Plantar fasciitis    Seizure (HCC)    Stroke (HCC)     Synovitis    TIA (transient ischemic attack)    Medications:  No PTA anticoagulation  PTA ASA 81 mg   Assessment: [name] [name] is a 77 yo female that presented today with RUQ abdominal pain that radiates into her right side and up into her right shoulder. PMH is significant for cholelithiasis, kidney stones, hypothyroidism, seizures, and CVA. Reports CVA was about 10 months ago for which she was briefly on anticoagulation but currently only takes aspirin. CT of the abdomen/pelvis shows a large pulmonary embolism with an area of pulmonary infarction. Pharmacy has been consulted to start apixaban. CBC stable. No DOAC PTA.    Goal of Therapy:  Heparin level 0.3-0.7 units/ml Monitor platelets by anticoagulation protocol: Yes   Plan:  Transition to apixaban 10 mg BID x 7 days followed by 5 mg BID.   Paschal Dopp, PharmD 08/23/2023 9:53 AM

## 2023-08-23 NOTE — Progress Notes (Signed)
Progress Note    [name] [name]  NWG:[id number] DOB: 06/30/1946  DOA: 08/21/2023 PCP: [name] Humphrey, MD      Brief Narrative:    Medical records reviewed and are as summarized below:  [name] [name] is a 77 y.o. female with medical history significant for stroke in January 2024 on aspirin, Plavix discontinued because of epistaxis, depression, seizure disorder, who presented to the hospital with right-sided upper abdominal pain and back pain.  He did not report any shortness of breath or chest pain but she complained of pleuritic pain mainly on the right upper abdomen and backside.  No recent travel or surgeries.  She was found to have acute pulmonary embolism with RV to LV ratio of 1.22.Marland Kitchen       Assessment/Plan:   Principal Problem:   Pulmonary emboli (HCC) Active Problems:   Cholelithiasis without cholecystitis   History of CVA (cerebrovascular accident)   Seizure disorder (HCC)   Paroxysmal atrial fibrillation with RVR (HCC)    Body mass index is 25.93 kg/m.   Acute bilateral pulmonary embolism: S/p mechanical thrombectomy to the left lower lobe pulmonary artery and right lower lobe pulmonary artery on 08/22/2023.  Discontinue IV heparin drip and start Eliquis. Obtain venous duplex of the lower extremities to check for DVT because of lower extremity pain. Follow-up with vascular surgeon. Outpatient follow-up with hematologist for genetic workup.  PCP can arrange this.   Acute hypoxemic respiratory failure: She is still requiring 2 L/min oxygen via [place].  Oxygen saturation was 93% on this morning.  Wean off oxygen as able.  Check pulse oximetry with ambulation.   Paroxysmal atrial fibrillation with rapid ventricular response: She is back in normal sinus rhythm.  She will be started on Eliquis She had rapid A-fib during pulmonary thrombectomy.  Patient recalls a history of paroxysmal atrial fibrillation sometime in September 2023 but  she converted to normal  rhythm spontaneously without any medications.   Seizure disorder: Continue Keppra   History of stroke: She was on aspirin prior to admission.  This will be discontinued at discharge since she will be on Eliquis for long-term anticoagulation. Of note, she used to take Plavix but this was discontinued in April 2024 because of epistaxis.   Other comorbidities include cholelithiasis, kidney stones, IBS, hypothyroidism, hemorrhoids, depression, arthritis   Diet Order             Diet regular Room service appropriate? Yes; Fluid consistency: Thin  Diet effective now                            Consultants: Vascular surgeon  Procedures: Mechanical thrombectomy    Medications:    apixaban  10 mg Oral BID   Followed by   Melene Muller ON 08/30/2023] apixaban  5 mg Oral BID   atorvastatin  40 mg Oral Daily   gatifloxacin  1 drop Right Eye QID   ketorolac  1 drop Right Eye QID   levETIRAcetam  1,000 mg Oral Daily   prednisoLONE acetate  1 drop Right Eye QID   Continuous Infusions:     Anti-infectives (From admission, onward)    Start     Dose/Rate Route Frequency Ordered Stop   08/22/23 0921  ceFAZolin (ANCEF) IVPB 2g/100 mL premix  Status:  Discontinued        2 g 200 mL/hr over 30 Minutes Intravenous 30 min pre-op 08/22/23 0921 08/22/23 1303  Family Communication/Anticipated D/C date and plan/Code Status   DVT prophylaxis:  apixaban (ELIQUIS) tablet 10 mg  apixaban (ELIQUIS) tablet 5 mg     Code Status: Full Code  Family Communication: Plan discussed with [name] [name], daughter, at the bedside Disposition Plan: Plan to discharge home tomorrow   Status is: Inpatient Remains inpatient appropriate because: Acute pulmonary embolism       Subjective:   Interval events noted.  She complains of pain in the right side of her upper back.  Pain is pleuritic in nature.  She is worried about taking deep breaths because of the pain but she  does not really feel short of breath.  She has some pain in the left leg.  [name] [name], daughter was at the bedside  Objective:    Vitals:   08/22/23 2338 08/23/23 0435 08/23/23 0436 08/23/23 0758  BP: (!) 127/56  (!) 122/51 (!) 125/41  Pulse: 67  64 69  Resp: 18  18 18   Temp: 98.9 F (37.2 C)  98.5 F (36.9 C) 99.1 F (37.3 C)  TempSrc:      SpO2: 97%  97% 93%  Weight:  66.4 kg    Height:       No data found.   Intake/Output Summary (Last 24 hours) at 08/23/2023 1117 Last data filed at 08/22/2023 1826 Gross per 24 hour  Intake 502.5 ml  Output --  Net 502.5 ml   Filed Weights   08/21/23 0855 08/22/23 0953 08/23/23 0435  Weight: 70.7 kg 70.7 kg 66.4 kg    Exam:  GEN: NAD SKIN: Warm and dry EYES: No pallor or icterus ENT: MMM CV: RRR PULM: CTA B ABD: soft, ND, NT, +BS CNS: AAO x 3, non focal EXT: No edema or tenderness         Data Reviewed:   I have personally reviewed following labs and imaging studies:  Labs: Labs show the following:   Basic Metabolic Panel: Recent Labs  Lab 08/21/23 0856 08/22/23 0535  NA 137 137  K 3.7 4.0  CL 101 102  CO2 24 25  GLUCOSE 120* 106*  BUN 14 15  CREATININE 0.82 0.88  CALCIUM 8.7* 8.0*   GFR Estimated Creatinine Clearance: 49 mL/min (by C-G formula based on SCr of 0.88 mg/dL). Liver Function Tests: Recent Labs  Lab 08/21/23 0856 08/22/23 0535  AST 32 25  ALT 26 19  ALKPHOS 82 68  BILITOT 1.4* 1.0  PROT 7.9 6.6  ALBUMIN 4.3 3.4*   Recent Labs  Lab 08/21/23 0856  LIPASE 42   No results for input(s): "AMMONIA" in the last 168 hours. Coagulation profile Recent Labs  Lab 08/21/23 1231  INR 1.1    CBC: Recent Labs  Lab 08/21/23 0856 08/22/23 0535 08/23/23 0423  WBC 10.7* 9.6 10.1  NEUTROABS 8.0*  --   --   HGB 15.1* 13.3 11.6*  HCT 45.6 40.6 34.9*  MCV 94.4 96.0 94.3  PLT 240 201 174   Cardiac Enzymes: No results for input(s): "CKTOTAL", "CKMB", "CKMBINDEX", "TROPONINI" in the  last 168 hours. BNP (last 3 results) No results for input(s): "PROBNP" in the last 8760 hours. CBG: No results for input(s): "GLUCAP" in the last 168 hours. D-Dimer: No results for input(s): "DDIMER" in the last 72 hours. Hgb A1c: No results for input(s): "HGBA1C" in the last 72 hours. Lipid Profile: No results for input(s): "CHOL", "HDL", "LDLCALC", "TRIG", "CHOLHDL", "LDLDIRECT" in the last 72 hours. Thyroid function studies: No results for input(s): "TSH", "T4TOTAL", "  T3FREE", "THYROIDAB" in the last 72 hours.  Invalid input(s): "FREET3" Anemia work up: No results for input(s): "VITAMINB12", "FOLATE", "FERRITIN", "TIBC", "IRON", "RETICCTPCT" in the last 72 hours. Sepsis Labs: Recent Labs  Lab 08/21/23 0856 08/22/23 0535 08/23/23 0423  WBC 10.7* 9.6 10.1    Microbiology Recent Results (from the past 240 hour(s))  Urine Culture     Status: Abnormal   Collection Time: 08/21/23  8:56 AM   Specimen: Urine, Clean Catch  Result Value Ref Range Status   Specimen Description   Final    URINE, CLEAN CATCH Performed at [facility], [address], [place], [place] [address]    Special Requests   Final    NONE Performed at [facility], [address], [place], [place] [address]    Culture (A)  Final    <10,000 COLONIES/mL INSIGNIFICANT GROWTH Performed at [facility], [address], [place], [place] [address]    Report Status 08/22/2023 FINAL  Final    Procedures and diagnostic studies:  PERIPHERAL VASCULAR CATHETERIZATION  Result Date: 08/22/2023 See surgical note for result.  US Abdomen Limited RUQ (LIVER/GB)  Result Date: 08/21/2023 CLINICAL DATA:  Right upper quadrant abdominal pain radiating to the back since last night. History of gallstones. EXAM: ULTRASOUND ABDOMEN LIMITED RIGHT UPPER QUADRANT COMPARISON:  CT abdomen pelvis from same day. Right upper quadrant ultrasound dated May 19, 2023. FINDINGS: Gallbladder: Multiple  gallstones again noted. No wall thickening visualized. No sonographic Murphy sign noted by sonographer. Common bile duct: Diameter: 4 mm, normal. Liver: No focal lesion identified. Within normal limits in parenchymal echogenicity. Portal vein is patent on color Doppler imaging with normal direction of blood flow towards the liver. Other: None. IMPRESSION: 1. Cholelithiasis without sonographic evidence of acute cholecystitis. Electronically Signed   By: Obie Dredge M.D.   On: 08/21/2023 15:38   ECHOCARDIOGRAM COMPLETE  Result Date: 08/21/2023    ECHOCARDIOGRAM REPORT   Patient Name:   ELYSA WOMAC Brining Date of Exam: 08/21/2023 Medical Rec #:  [id number]       Height:       63.0 in Accession #:    [id number]      Weight:       155.9 lb Date of Birth:  October 13, 1946      BSA:          1.739 m Patient Age:    76 years        BP:           111/79 mmHg Patient Gender: F               HR:           79 bpm. Exam Location:  [facility] Procedure: 2D Echo Indications:     Pulmonary Embolus I26.09  History:         Patient has no prior history of Echocardiogram examinations.  Sonographer:     Overton Mam RDCS, FASE Referring Phys:  [id number] Francoise Schaumann NEWTON Diagnosing Phys: Marcina Millard MD IMPRESSIONS  1. Left ventricular ejection fraction, by estimation, is 60 to 65%. The left ventricle has normal function. The left ventricle has no regional wall motion abnormalities. Left ventricular diastolic parameters are consistent with Grade I diastolic dysfunction (impaired relaxation).  2. Right ventricular systolic function is normal. The right ventricular size is normal.  3. The mitral valve is normal in structure. Trivial mitral valve regurgitation. No evidence of mitral stenosis.  4. The aortic valve is normal  in structure. Aortic valve regurgitation is not visualized. No aortic stenosis is present.  5. The inferior vena cava is normal in size with greater than 50% respiratory variability, suggesting right atrial pressure of 3  mmHg. FINDINGS  Left Ventricle: Left ventricular ejection fraction, by estimation, is 60 to 65%. The left ventricle has normal function. The left ventricle has no regional wall motion abnormalities. The left ventricular internal cavity size was normal in size. There is  no left ventricular hypertrophy. Left ventricular diastolic parameters are consistent with Grade I diastolic dysfunction (impaired relaxation). Right Ventricle: The right ventricular size is normal. No increase in right ventricular wall thickness. Right ventricular systolic function is normal. Left Atrium: Left atrial size was normal in size. Right Atrium: Right atrial size was normal in size. Pericardium: There is no evidence of pericardial effusion. Mitral Valve: The mitral valve is normal in structure. Trivial mitral valve regurgitation. No evidence of mitral valve stenosis. Tricuspid Valve: The tricuspid valve is normal in structure. Tricuspid valve regurgitation is trivial. No evidence of tricuspid stenosis. Aortic Valve: The aortic valve is normal in structure. Aortic valve regurgitation is not visualized. No aortic stenosis is present. Aortic valve peak gradient measures 7.0 mmHg. Pulmonic Valve: The pulmonic valve was normal in structure. Pulmonic valve regurgitation is not visualized. No evidence of pulmonic stenosis. Aorta: The aortic root is normal in size and structure. Venous: The inferior vena cava is normal in size with greater than 50% respiratory variability, suggesting right atrial pressure of 3 mmHg. IAS/Shunts: No atrial level shunt detected by color flow Doppler.  LEFT VENTRICLE PLAX 2D LVIDd:         4.40 cm   Diastology LVIDs:         2.50 cm   LV e' medial:    6.20 cm/s LV PW:         0.90 cm   LV E/e' medial:  9.3 LV IVS:        1.00 cm   LV e' lateral:   8.70 cm/s LVOT diam:     1.80 cm   LV E/e' lateral: 6.6 LV SV:         40 LV SV Index:   23 LVOT Area:     2.54 cm  RIGHT VENTRICLE RV Basal diam:  2.20 cm RV S prime:      17.60 cm/s TAPSE (M-mode): 1.6 cm LEFT ATRIUM             Index        RIGHT ATRIUM          Index LA diam:        2.70 cm 1.55 cm/m   RA Area:     6.33 cm LA Vol (A2C):   25.3 ml 14.55 ml/m  RA Volume:   9.80 ml  5.63 ml/m LA Vol (A4C):   22.4 ml 12.88 ml/m LA Biplane Vol: 25.8 ml 14.83 ml/m  AORTIC VALVE AV Area (Vmax): 1.91 cm AV Vmax:        132.00 cm/s AV Peak Grad:   7.0 mmHg LVOT Vmax:      99.00 cm/s LVOT Vmean:     64.200 cm/s LVOT VTI:       0.157 m  AORTA Ao Root diam: 3.00 cm Ao Asc diam:  2.60 cm MITRAL VALVE MV Area (PHT): 3.08 cm    SHUNTS MV Decel Time: 246 msec    Systemic VTI:  0.16 m MV E velocity: 57.60 cm/s  Systemic  Diam: 1.80 cm MV A velocity: 87.50 cm/s MV E/A ratio:  0.66 Marcina Millard MD Electronically signed by Marcina Millard MD Signature Date/Time: 08/21/2023/3:17:38 PM    Final                LOS: 2 days   Ellasyn Swilling  [facility]   Pager on [email or web address]. If 7PM-7AM, please contact night-coverage at [email or web address]     08/23/2023, 11:17 AM

## 2023-08-23 NOTE — Consult Note (Signed)
PHARMACY - ANTICOAGULATION CONSULT NOTE  Pharmacy Consult for Heparin  Indication: pulmonary embolus  Allergies  Allergen Reactions   Prednisone Nausea And Vomiting    Other reaction(s): Vomiting    Augmentin [Amoxicillin-Pot Clavulanate] Nausea And Vomiting   Codeine Other (See Comments)    Other reaction(s): Unknown Other reaction(s): Unknown Other reaction(s): Unknown 'feel really strange' no n/v    Levofloxacin    Omnicef [Cefdinir]    Trimethoprim Other (See Comments)   Erythromycin Diarrhea    Other reaction(s): Unknown Other reaction(s): Unknown   Sulfamethoxazole-Trimethoprim Diarrhea   Patient Measurements: Height: 5\' 3"  (160 cm) Weight: 70.7 kg (155 lb 13.8 oz) IBW/kg (Calculated) : 52.4 Heparin Dosing Weight: 67.1 kg   Vital Signs: Temp: 98.9 F (37.2 C) (10/14 2338) Temp Source: Oral (10/14 1627) BP: 127/56 (10/14 2338) Pulse Rate: 67 (10/14 2338)  Labs: Recent Labs    08/21/23 0856 08/21/23 1231 08/21/23 2058 08/22/23 0535 08/22/23 0700 08/22/23 2352  HGB 15.1*  --   --  13.3  --   --   HCT 45.6  --   --  40.6  --   --   PLT 240  --   --  201  --   --   APTT  --  28  --   --   --   --   LABPROT  --  14.3  --   --   --   --   INR  --  1.1  --   --   --   --   HEPARINUNFRC  --   --  >1.10*  --  0.81* 0.25*  CREATININE 0.82  --   --  0.88  --   --   TROPONINIHS 5  --   --   --   --   --    Estimated Creatinine Clearance: 50.5 mL/min (by C-G formula based on SCr of 0.88 mg/dL).  Medical History: Past Medical History:  Diagnosis Date   Allergic genetic state    Arthritis    Cholelithiasis    Depression    Diarrhea    Fibroids    Hearing loss    Hemorrhoids    Hypothyroidism    IBS (irritable bowel syndrome)    Kidney stones    Osteoarthrosis    Plantar fasciitis    Seizure (HCC)    Stroke (HCC)    Synovitis    TIA (transient ischemic attack)    Medications:  No PTA anticoagulation  PTA ASA 81 mg   Assessment: [name] [name]  is a 77 yo female that presented today with RUQ abdominal pain that radiates into her right side and up into her right shoulder. PMH is significant for cholelithiasis, kidney stones, hypothyroidism, seizures, and CVA. Reports CVA was about 10 months ago for which she was briefly on anticoagulation but currently only takes aspirin. CT of the abdomen/pelvis shows a large pulmonary embolism with an area of pulmonary infarction. Pharmacy has been consulted for management of heparin infusion. CBC stable. No DOAC PTA.   10/14 0700 0.81 10/15 2352 0.25, subtherapeutic  Goal of Therapy:  Heparin level 0.3-0.7 units/ml Monitor platelets by anticoagulation protocol: Yes   Plan:  Heparin level was previously supratherapeutic at 950. Bolus 1000 units x 1 Increase heparin infusion to 850 units/hr Recheck heparin level in 8 hours CBC daily while on heparin.   Otelia Sergeant, PharmD, [facility] 08/23/2023 12:58 AM

## 2023-08-24 DIAGNOSIS — I480 Paroxysmal atrial fibrillation: Secondary | ICD-10-CM | POA: Diagnosis not present

## 2023-08-24 DIAGNOSIS — G40909 Epilepsy, unspecified, not intractable, without status epilepticus: Secondary | ICD-10-CM | POA: Diagnosis not present

## 2023-08-24 DIAGNOSIS — J9601 Acute respiratory failure with hypoxia: Secondary | ICD-10-CM

## 2023-08-24 DIAGNOSIS — K8020 Calculus of gallbladder without cholecystitis without obstruction: Secondary | ICD-10-CM

## 2023-08-24 DIAGNOSIS — I2699 Other pulmonary embolism without acute cor pulmonale: Secondary | ICD-10-CM | POA: Diagnosis not present

## 2023-08-24 MED ORDER — ACETAMINOPHEN 325 MG PO TABS
650.0000 mg | ORAL_TABLET | Freq: Four times a day (QID) | ORAL | Status: DC | PRN
  Administered 2023-08-24: 650 mg via ORAL
  Filled 2023-08-24: qty 2

## 2023-08-24 MED ORDER — APIXABAN 5 MG PO TABS: ORAL_TABLET | ORAL | 0 refills | Status: AC

## 2023-08-24 NOTE — Progress Notes (Signed)
Physical Therapy Treatment Patient Details Name: [name] [name] MRN: [id number] DOB: 1946/07/06 Today's Date: 08/24/2023   History of Present Illness Patient is a 77 y.o. 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 Current MD assessment: PE now s/p pulmonary thrombectomy.    PT Comments  Pt was pleasant and motivated to participate during the session and put forth good effort throughout. Pt continues to improve with overall activity tolerance. She is Mod I for bed mobility and transfers. Only needing CGA when walking with RW, able to perform 160 foot bout of amb with SpO2 remaining above 90% and HR WFL, gait continues to be slow but pt showing greater confidence walking further distances. Additionally pt was able to perform various static and dynamic balance activities per below with good stability, only had minor imbalance with horizontal head turns but was able to self correct. Pt will benefit from continued PT services upon discharge to safely address deficits listed in patient problem list for decreased caregiver assistance and eventual return to PLOF.      If plan is discharge home, recommend the following: A little help with walking and/or transfers;A little help with bathing/dressing/bathroom;Assist for transportation;Help with stairs or ramp for entrance   Can travel by private vehicle        Equipment Recommendations  None recommended by PT    Recommendations for Other Services       Precautions / Restrictions Precautions Precautions: Fall Restrictions Weight Bearing Restrictions: No RLE Weight Bearing: Non weight bearing     Mobility  Bed Mobility Overal bed mobility: Modified Independent             General bed mobility comments: pt in chair at start/end of session    Transfers Overall transfer level: Modified independent Equipment used: Rolling walker (2 wheels)                     Ambulation/Gait Ambulation/Gait assistance: Contact guard assist Gait Distance (Feet): 160 Feet Assistive device: Rolling walker (2 wheels) Gait Pattern/deviations: Decreased step length - right, Decreased step length - left, Decreased stride length Gait velocity: decreased     General Gait Details: Improvement in overall gait quality, no imbalance noted   Stairs             Wheelchair Mobility     Tilt Bed    Modified Rankin (Stroke Patients Only)       Balance Overall balance assessment: No apparent balance deficits (not formally assessed)                                          Cognition Arousal: Alert Behavior During Therapy: WFL for tasks assessed/performed Overall Cognitive Status: Within Functional Limits for tasks assessed                                          Exercises Other Exercises Other Exercises: Performed static balance for 30 sec each in: normal stance, NBOS, and semi-tandem positions. Other Exercises: performed dynsmic balance with horizonal head turns for 20 seach in: normal stance,and NBOS. Pt had minor imbalance but was able to self correct    General Comments        Pertinent Vitals/Pain Pain  Assessment Pain Assessment: 0-10 Pain Score: 2  Pain Location: LE's Pain Descriptors / Indicators: Sore Pain Intervention(s): Monitored during session    Home Living Family/patient expects to be discharged to:: Private residence Living Arrangements: Alone Available Help at Discharge: Family;Available 24 hours/day Type of Home: House Home Access: Level entry       Home Layout: One level Home Equipment: Cane - single point;Rollator (4 wheels);Rolling Walker (2 wheels);Shower seat - built in;Grab bars - tub/shower Additional Comments: daughter from [place] can stay with her    Prior Function            PT Goals (current goals can now be found in the care plan section)  Progress towards PT goals: Progressing toward goals    Frequency    Min 1X/week      PT Plan      Co-evaluation              AM-PAC PT "6 Clicks" Mobility   Outcome Measure  Help needed turning from your back to your side while in a flat bed without using bedrails?: None Help needed moving from lying on your back to sitting on the side of a flat bed without using bedrails?: None Help needed moving to and from a bed to a chair (including a wheelchair)?: A Little Help needed standing up from a chair using your arms (e.g., wheelchair or bedside chair)?: A Little Help needed to walk in hospital room?: A Little Help needed climbing 3-5 steps with a railing? : A Little 6 Click Score: 20    End of Session Equipment Utilized During Treatment: Gait belt Activity Tolerance: Patient tolerated treatment well Patient left: with family/visitor present;with call bell/phone within reach;with chair alarm set;in chair Nurse Communication: Mobility status PT Visit Diagnosis: Other abnormalities of gait and mobility (R26.89);Difficulty in walking, not elsewhere classified (R26.2)     Time: 1308-6578 PT Time Calculation (min) (ACUTE ONLY): 25 min  Charges:                           Cecile Sheerer, SPT 08/24/23, 1:45 PM

## 2023-08-24 NOTE — Evaluation (Signed)
Occupational Therapy Evaluation Patient Details Name: Valetta Mulroy Sterbenz MRN: [id number] DOB: 06/14/1946 Today's Date: 08/24/2023   History of Present Illness Patient is a 77 y.o. 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 Current MD assessment: PE now s/p pulmonary thrombectomy.   Clinical Impression   Ms Bardales was seen for OT evaluation this date. Prior to hospital admission, pt was IND for ADLs and mobility, going to outpatient PT. Pt lives alone. Pt currently requires SUPERVISION + RW for ADL t/f ~200 ft. MOD I + RW for toilet t/f and standing grooming tasks. Pt would benefit from skilled OT to address noted impairments and functional limitations (see below for any additional details). Upon hospital discharge, recommend no OT follow up.    If plan is discharge home, recommend the following: Help with stairs or ramp for entrance    Functional Status Assessment  Patient has had a recent decline in their functional status and demonstrates the ability to make significant improvements in function in a reasonable and predictable amount of time.  Equipment Recommendations  None recommended by OT    Recommendations for Other Services       Precautions / Restrictions Precautions Precautions: Fall Restrictions Weight Bearing Restrictions: No RLE Weight Bearing: Non weight bearing      Mobility Bed Mobility Overal bed mobility: Modified Independent                  Transfers Overall transfer level: Modified independent Equipment used: Rolling walker (2 wheels)                      Balance Overall balance assessment: No apparent balance deficits (not formally assessed)                                         ADL either performed or assessed with clinical judgement   ADL Overall ADL's : Needs assistance/impaired                                        General ADL Comments: SUPERVISION + RW for ADL t/f ~200 ft. MOD I + RW for toilet t/f and standing grooming tasks.      Pertinent Vitals/Pain Pain Assessment Pain Assessment: No/denies pain     Extremity/Trunk Assessment Upper Extremity Assessment Upper Extremity Assessment: Overall WFL for tasks assessed   Lower Extremity Assessment Lower Extremity Assessment: Generalized weakness       Communication Communication Communication: No apparent difficulties   Cognition Arousal: Alert Behavior During Therapy: WFL for tasks assessed/performed Overall Cognitive Status: Within Functional Limits for tasks assessed                                                  Home Living Family/patient expects to be discharged to:: Private residence Living Arrangements: Alone Available Help at Discharge: Family;Available 24 hours/day Type of Home: House Home Access: Level entry     Home Layout: One level     Bathroom Shower/Tub: Producer, television/film/video: Handicapped height Bathroom Accessibility: Yes   Home Equipment: Cane - single  point;Rollator (4 wheels);Rolling Walker (2 wheels);Shower seat - built in;Grab bars - tub/shower   Additional Comments: daughter from [place] can stay with her      Prior Functioning/Environment Prior Level of Function : Independent/Modified Independent             Mobility Comments: Currently going to OP PT, full commmunity amb. No fall hx          OT Problem List: Decreased activity tolerance;Decreased strength      OT Treatment/Interventions: Therapeutic exercise;Self-care/ADL training;DME and/or AE instruction;Energy conservation;Therapeutic activities;Patient/family education;Balance training    OT Goals(Current goals can be found in the care plan section) Acute Rehab OT Goals Patient Stated Goal: to return to PLOF OT Goal Formulation: With patient/family Time For Goal Achievement:  09/07/23 Potential to Achieve Goals: Good ADL Goals Pt Will Perform Grooming: Independently;standing Pt Will Perform Lower Body Dressing: Independently;sit to/from stand Pt Will Transfer to Toilet: Independently;ambulating;regular height toilet Pt Will Perform Toileting - Clothing Manipulation and hygiene: Independently;sit to/from stand  OT Frequency: Min 1X/week    Co-evaluation              AM-PAC OT "6 Clicks" Daily Activity     Outcome Measure Help from another person eating meals?: None Help from another person taking care of personal grooming?: None Help from another person toileting, which includes using toliet, bedpan, or urinal?: None Help from another person bathing (including washing, rinsing, drying)?: A Little Help from another person to put on and taking off regular upper body clothing?: None Help from another person to put on and taking off regular lower body clothing?: A Little 6 Click Score: 22   End of Session Equipment Utilized During Treatment: Rolling walker (2 wheels)  Activity Tolerance: Patient tolerated treatment well Patient left: in chair;with call bell/phone within reach  OT Visit Diagnosis: Other abnormalities of gait and mobility (R26.89);Muscle weakness (generalized) (M62.81)                Time: 1610-9604 OT Time Calculation (min): 16 min Charges:  OT General Charges $OT Visit: 1 Visit OT Evaluation $OT Eval Moderate Complexity: 1 Mod  Kathie Dike, M.S. OTR/L  08/24/23, 10:55 AM  ascom [phone]

## 2023-08-24 NOTE — Discharge Summary (Signed)
Physician Discharge Summary   Patient: [name] [name] MRN: [id number] DOB: 11/04/1946  Admit date:     08/21/2023  Discharge date: 08/24/23  Discharge Physician: Marcelino Duster   PCP: Rayetta Humphrey, MD   Recommendations at discharge:  {Tip this will not be part of the note when signed- Example include specific recommendations for outpatient follow-up, pending tests to follow-up on. (Optional):26781}  ***  Discharge Diagnoses: Principal Problem:   Pulmonary emboli (HCC) Active Problems:   Cholelithiasis without cholecystitis   History of CVA (cerebrovascular accident)   Seizure disorder (HCC)   Paroxysmal atrial fibrillation with RVR (HCC)  Resolved Problems:   * No resolved hospital problems. [facility] Course: No notes on file  Assessment and Plan: * Pulmonary emboli (HCC) Recurrent right-sided abdominal pain with radiation of the back and chest CT imaging concerning for bilateral pulmonary embolism with segmental pulmonary artery filling defects within the lower lobes. RV to LV ratio 1.22 Hemodynamically stable at present Started on heparin drip in the ER-continue 2D echo Vascular surgery with Dr. Criss Alvine made aware of case Follow-up vascular surgery recommendations  Cholelithiasis without cholecystitis Reproducible right upper quadrant tenderness on exam with noted cholelithiasis without cholecystitis on CT scan Will check right upper quadrant ultrasound to correlate LFT stable T. 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 , ,  (Optional):26781}  {(NOTE) Pain control PDMP Statment (Optional):26782} Consultants: *** Procedures performed: ***  Disposition: {Plan; Disposition:26390} Diet recommendation:  Discharge Diet Orders (From admission, onward)     Start     Ordered   08/24/23 0000  Diet - low sodium heart healthy        08/24/23 1051           {Diet_Plan:26776} DISCHARGE MEDICATION: Allergies as of 08/24/2023       Reactions   Prednisone Nausea And Vomiting   Other reaction(s): Vomiting   Augmentin [amoxicillin-pot Clavulanate] Nausea And Vomiting   Codeine Other (See Comments)   Other reaction(s): Unknown Other reaction(s): Unknown Other reaction(s): Unknown 'feel really strange' no n/v   Levofloxacin    Omnicef [cefdinir]    Trimethoprim Other (See Comments)   Erythromycin Diarrhea   Other reaction(s): Unknown Other reaction(s): Unknown   Sulfamethoxazole-trimethoprim Diarrhea        Medication List     STOP taking these medications    aspirin EC 81 MG tablet       TAKE these medications    ACACIA SYRUP PO 1.5 tsp per day   apixaban 5 MG Tabs tablet Commonly known as: ELIQUIS Take 2 tablets (10 mg total) by mouth 2 (two) times daily for 6 days, THEN 1 tablet (5 mg total) 2 (two) times daily. Start taking on: August 24, 2023   atorvastatin 40 MG tablet Commonly known as: LIPITOR Take 40 mg by mouth daily.   Calcium 600+D Plus Minerals 600-400 MG-UNIT Tabs Take 1 tablet by mouth daily.   EPINEPHrine 0.3 mg/0.3 mL Soaj injection Commonly known as: EPI-PEN Inject 0.3 mg into the muscle  as needed for anaphylaxis.   fexofenadine 180 MG tablet Commonly known as: ALLEGRA Take 180 mg by mouth daily.   gatifloxacin 0.5 % Soln Commonly known as: ZYMAXID Place 1 drop into the right eye 4 (four) times daily.   ketorolac 0.5 % ophthalmic solution Commonly known as: ACULAR Place 1 drop  into the right eye 4 (four) times daily.   levETIRAcetam 500 MG 24 hr tablet Commonly known as: KEPPRA XR Take 1,000 mg by mouth daily.   meclizine 12.5 MG tablet Commonly known as: ANTIVERT Take 1 tablet (12.5 mg total) by mouth 3 (three) times daily as needed for up to 9 doses for dizziness.   mometasone 50 MCG/ACT nasal spray Commonly known as: NASONEX Place 2 sprays into the nose daily.   NONFORMULARY OR COMPOUNDED ITEM Inject into the muscle once a week. Compounded allergy shot.   prednisoLONE acetate 1 % ophthalmic suspension Commonly known as: PRED FORTE Place 1 drop into the right eye 4 (four) times daily.   Vitamin B-12 CR 1000 MCG Tbcr Take 1 tablet by mouth daily.   Vitamin D3 50 MCG (2000 UT) capsule Take 2,000 Units by mouth in the morning and at bedtime.   zoledronic acid 5 MG/100ML Soln injection Commonly known as: RECLAST Inject into the vein. Every 12 months        Discharge Exam: Filed Weights   08/21/23 0855 08/22/23 0953 08/23/23 0435  Weight: 70.7 kg 70.7 kg 66.4 kg   ***  Condition at discharge: {DC Condition:26389}  The results of significant diagnostics from this hospitalization (including imaging, microbiology, ancillary and laboratory) are listed below for reference.   Imaging Studies: US Venous Img Lower Bilateral (DVT)  Result Date: 08/23/2023 CLINICAL DATA:  Bilateral lower extremity pain Recent CT positive for PE EXAM: Bilateral lower Extremity Venous Doppler Ultrasound TECHNIQUE: Gray-scale sonography with compression, as well as color and duplex ultrasound, were performed to evaluate the deep venous system(s) from the level of the common femoral vein through the popliteal and proximal calf veins. COMPARISON:  None available FINDINGS: VENOUS Normal compressibility of the common femoral, superficial femoral, and popliteal veins, as well as the visualized calf veins. Visualized portions of profunda femoral vein and great saphenous vein  unremarkable. No filling defects to suggest DVT on grayscale or color Doppler imaging. Doppler waveforms show normal direction of venous flow, normal respiratory plasticity and response to augmentation. OTHER None. Limitations: none IMPRESSION: No  lower extremity DVT. Electronically Signed   By: Acquanetta Belling M.D.   On: 08/23/2023 15:16   PERIPHERAL VASCULAR CATHETERIZATION  Result Date: 08/22/2023 See surgical note for result.  US Abdomen Limited RUQ (LIVER/GB)  Result Date: 08/21/2023 CLINICAL DATA:  Right upper quadrant abdominal pain radiating to the back since last night. History of gallstones. EXAM: ULTRASOUND ABDOMEN LIMITED RIGHT UPPER QUADRANT COMPARISON:  CT abdomen pelvis from same day. Right upper quadrant ultrasound dated May 19, 2023. FINDINGS: Gallbladder: Multiple gallstones again noted. No wall thickening visualized. No sonographic Murphy sign noted by sonographer. Common bile duct: Diameter: 4 mm, normal. Liver: No focal lesion identified. Within normal limits in parenchymal echogenicity. Portal vein is patent on color Doppler imaging with normal direction of blood flow towards the liver. Other: None. IMPRESSION: 1. Cholelithiasis without sonographic evidence of acute cholecystitis. Electronically Signed   By: Obie Dredge M.D.   On: 08/21/2023 15:38   ECHOCARDIOGRAM COMPLETE  Result Date: 08/21/2023    ECHOCARDIOGRAM REPORT   Patient Name:   [name] [name]  Date of Exam: 08/21/2023 Medical Rec #:  [id number]       Height:       63.0 in Accession #:    [id number]      Weight:       155.9 lb Date of Birth:  21-Oct-1946      BSA:          1.739 m Patient Age:    76 years        BP:           111/79 mmHg Patient Gender: F               HR:           79 bpm. Exam Location:  [facility] Procedure: 2D Echo Indications:     Pulmonary Embolus I26.09  History:         Patient has no prior history of Echocardiogram examinations.  Sonographer:     Overton Mam RDCS, FASE Referring Phys:  [id number]  Francoise Schaumann NEWTON Diagnosing Phys: Marcina Millard MD IMPRESSIONS  1. Left ventricular ejection fraction, by estimation, is 60 to 65%. The left ventricle has normal function. The left ventricle has no regional wall motion abnormalities. Left ventricular diastolic parameters are consistent with Grade I diastolic dysfunction (impaired relaxation).  2. Right ventricular systolic function is normal. The right ventricular size is normal.  3. The mitral valve is normal in structure. Trivial mitral valve regurgitation. No evidence of mitral stenosis.  4. The aortic valve is normal in structure. Aortic valve regurgitation is not visualized. No aortic stenosis is present.  5. The inferior vena cava is normal in size with greater than 50% respiratory variability, suggesting right atrial pressure of 3 mmHg. FINDINGS  Left Ventricle: Left ventricular ejection fraction, by estimation, is 60 to 65%. The left ventricle has normal function. The left ventricle has no regional wall motion abnormalities. The left ventricular internal cavity size was normal in size. There is  no left ventricular hypertrophy. Left ventricular diastolic parameters are consistent with Grade I diastolic dysfunction (impaired relaxation). Right Ventricle: The right ventricular size is normal. No increase in right ventricular wall thickness. Right ventricular systolic function is normal. Left Atrium: Left atrial size was normal in size. Right Atrium: Right atrial size was normal in size. Pericardium: There is no evidence of pericardial effusion. Mitral Valve: The mitral valve is normal in structure. Trivial mitral valve regurgitation. No evidence of mitral valve stenosis. Tricuspid Valve: The tricuspid valve is normal in structure. Tricuspid valve regurgitation is trivial. No evidence of tricuspid stenosis. Aortic Valve: The aortic valve is normal in structure. Aortic valve regurgitation is not visualized. No aortic stenosis is present. Aortic valve peak  gradient measures 7.0 mmHg. Pulmonic Valve: The pulmonic valve was normal in structure. Pulmonic valve regurgitation is not visualized. No evidence of pulmonic stenosis. Aorta: The aortic root is normal in size and structure. Venous: The inferior vena cava is normal in size with greater than 50% respiratory variability, suggesting right atrial pressure of 3 mmHg. IAS/Shunts: No atrial level shunt detected by color flow Doppler.  LEFT VENTRICLE PLAX 2D LVIDd:         4.40 cm   Diastology LVIDs:         2.50 cm   LV e' medial:    6.20 cm/s LV PW:         0.90 cm   LV E/e' medial:  9.3 LV IVS:        1.00 cm  LV e' lateral:   8.70 cm/s LVOT diam:     1.80 cm   LV E/e' lateral: 6.6 LV SV:         40 LV SV Index:   23 LVOT Area:     2.54 cm  RIGHT VENTRICLE RV Basal diam:  2.20 cm RV S prime:     17.60 cm/s TAPSE (M-mode): 1.6 cm LEFT ATRIUM             Index        RIGHT ATRIUM          Index LA diam:        2.70 cm 1.55 cm/m   RA Area:     6.33 cm LA Vol (A2C):   25.3 ml 14.55 ml/m  RA Volume:   9.80 ml  5.63 ml/m LA Vol (A4C):   22.4 ml 12.88 ml/m LA Biplane Vol: 25.8 ml 14.83 ml/m  AORTIC VALVE AV Area (Vmax): 1.91 cm AV Vmax:        132.00 cm/s AV Peak Grad:   7.0 mmHg LVOT Vmax:      99.00 cm/s LVOT Vmean:     64.200 cm/s LVOT VTI:       0.157 m  AORTA Ao Root diam: 3.00 cm Ao Asc diam:  2.60 cm MITRAL VALVE MV Area (PHT): 3.08 cm    SHUNTS MV Decel Time: 246 msec    Systemic VTI:  0.16 m MV E velocity: 57.60 cm/s  Systemic Diam: 1.80 cm MV A velocity: 87.50 cm/s MV E/A ratio:  0.66 Marcina Millard MD Electronically signed by Marcina Millard MD Signature Date/Time: 08/21/2023/3:17:38 PM    Final    CT Angio Chest PE W/Cm &/Or Wo Cm  Addendum Date: 08/21/2023   ADDENDUM REPORT: 08/21/2023 11:27 ADDENDUM: Critical Value/emergent results were called by telephone at the time of interpretation on 08/21/2023 at 11:27 am to provider [facility] , who verbally acknowledged these results.  Electronically Signed   By: Signa Kell M.D.   On: 08/21/2023 11:27   Result Date: 08/21/2023 CLINICAL DATA:  Evaluate for pulmonary embolism. High probability. Right-sided chest pain. EXAM: CT ANGIOGRAPHY CHEST WITH CONTRAST TECHNIQUE: Multidetector CT imaging of the chest was performed using the standard protocol during bolus administration of intravenous contrast. Multiplanar CT image reconstructions and MIPs were obtained to evaluate the vascular anatomy. RADIATION DOSE REDUCTION: This exam was performed according to the departmental dose-optimization program which includes automated exposure control, adjustment of the mA and/or kV according to patient size and/or use of iterative reconstruction technique. CONTRAST:  50mL OMNIPAQUE IOHEXOL 350 MG/ML SOLN COMPARISON:  02/06/2013. FINDINGS: Cardiovascular: Satisfactory opacification of the pulmonary arteries to the segmental level. There are bilateral segmental pulmonary artery filling defects within the lower lobes compatible with acute pulmonary embolism, image 83/4 and image 71/4. The RV to LV ratio is equal to 1.22. Aortic atherosclerosis and coronary artery calcification. Normal heart size. No pericardial effusion. Mediastinum/Nodes: No enlarged mediastinal, hilar, or axillary lymph nodes. Thyroid gland, trachea, and esophagus demonstrate no significant findings. Lungs/Pleura: Subsegmental atelectasis and mild subpleural consolidation identified within the right base. Mild subsegmental atelectasis in the left lower lobe. Trace right pleural effusion. No suspicious pulmonary nodule or mass identified. Upper Abdomen: No acute abnormality. Gallstones identified. Asymmetric left renal cortical atrophy. Musculoskeletal: No chest wall abnormality. No acute or significant osseous findings. Review of the MIP images confirms the above findings. IMPRESSION: 1. Examination is positive for acute pulmonary embolism with bilateral segmental pulmonary  artery filling  defects within the lower lobes. RV to LV ratio is equal to 1.22. 2. Trace right pleural effusion with subsegmental atelectasis and mild subpleural consolidation within the right lower lobe. 3. Coronary artery calcification. 4. Cholelithiasis. 5. Aortic Atherosclerosis (ICD10-I70.0). Electronically Signed: By: Signa Kell M.D. On: 08/21/2023 11:19   CT ABDOMEN PELVIS W CONTRAST  Result Date: 08/21/2023 CLINICAL DATA:  77 year old female with history of acute onset of nonlocalized abdominal pain. EXAM: CT ABDOMEN AND PELVIS WITH CONTRAST TECHNIQUE: Multidetector CT imaging of the abdomen and pelvis was performed using the standard protocol following bolus administration of intravenous contrast. RADIATION DOSE REDUCTION: This exam was performed according to the departmental dose-optimization program which includes automated exposure control, adjustment of the mA and/or kV according to patient size and/or use of iterative reconstruction technique. CONTRAST:  75mL OMNIPAQUE IOHEXOL 300 MG/ML  SOLN COMPARISON:  CT of the abdomen and pelvis 11/06/2020. FINDINGS: Lower chest: In a right lower lobe pulmonary artery branch there is a nonocclusive filling defect (axial image 3 of series 2), indicative of pulmonary embolism. Trace right pleural effusion lying dependently. Areas of airspace consolidation and ground-glass attenuation in the right lower lobe concerning for pulmonary infarct with probable alveolar hemorrhage. Atherosclerotic calcifications are noted in the left circumflex and right coronary arteries. Hepatobiliary: No suspicious cystic or solid hepatic lesions. Numerous partially calcified gallstones are noted lying dependently in the gallbladder. Gallbladder is moderately distended. Gallbladder wall does not appear thickened or edematous. No intra or extrahepatic biliary ductal dilatation. Pancreas: No pancreatic mass. No pancreatic ductal dilatation. No pancreatic or peripancreatic fluid collections or  inflammatory changes. Spleen: Unremarkable. Adrenals/Urinary Tract: 11 mm low-attenuation lesion in the medial aspect of the interpolar region of the right kidney is compatible with a simple (Bosniak class 1) cyst which requires no imaging follow-up. Moderate atrophy of the left kidney. Bilateral adrenal glands are normal in appearance. No hydroureteronephrosis. Urinary bladder is unremarkable in appearance. Stomach/Bowel: The appearance of the stomach is normal. No pathologic dilatation of small bowel or colon. The appendix is not confidently identified and may be surgically absent. Regardless, there are no inflammatory changes noted adjacent to the cecum to suggest the presence of an acute appendicitis at this time. Vascular/Lymphatic: Atherosclerosis in the abdominal aorta and pelvic vasculature, without evidence of aneurysm or dissection. No lymphadenopathy noted in the abdomen or pelvis. Reproductive: Uterus is heterogeneous in appearance with calcified lesions likely to represent small fibroids, largest of which measures 2.1 cm. Left ovary is atrophic. 2.9 x 2.5 cm simple appearing cystic lesion in the right adnexa, similar to remote prior study from 11/06/2020, presumably a benign cyst (no imaging follow-up recommended). Other: No significant volume of ascites.  No pneumoperitoneum. Musculoskeletal: There are no aggressive appearing lytic or blastic lesions noted in the visualized portions of the skeleton. IMPRESSION: 1. No acute findings are noted in the abdomen or pelvis to account for the patient's symptoms. 2. However, there is an incidental pulmonary embolism in the right lower lobe distribution with what appears to be pulmonary infarction and alveolar hemorrhage in the right lower lobe, along with a trace right pleural effusion. 3. Cholelithiasis without evidence of acute cholecystitis at this time. 4. Moderate left renal atrophy. 5. Additional incidental findings, as above. Critical Value/emergent  results were called by telephone at the time of interpretation on 08/21/2023 at 10:36 am to provider Greig Right, who verbally acknowledged these results. Electronically Signed   By: Trudie Reed M.D.   On: 08/21/2023 10:36  Microbiology: Results for orders placed or performed during the hospital encounter of 08/21/23  Urine Culture     Status: Abnormal   Collection Time: 08/21/23  8:56 AM   Specimen: Urine, Clean Catch  Result Value Ref Range Status   Specimen Description   Final    URINE, CLEAN CATCH Performed at [facility], [address] [place], [place] [address]    Special Requests   Final    NONE Performed at [facility], [address] [place], [place] [address]    Culture (A)  Final    <10,000 COLONIES/mL INSIGNIFICANT GROWTH Performed at [facility], [address] [place], [place] [address]    Report Status 08/22/2023 FINAL  Final    Labs: CBC: Recent Labs  Lab 08/21/23 0856 08/22/23 0535 08/23/23 0423  WBC 10.7* 9.6 10.1  NEUTROABS 8.0*  --   --   HGB 15.1* 13.3 11.6*  HCT 45.6 40.6 34.9*  MCV 94.4 96.0 94.3  PLT 240 201 174   Basic Metabolic Panel: Recent Labs  Lab 08/21/23 0856 08/22/23 0535  NA 137 137  K 3.7 4.0  CL 101 102  CO2 24 25  GLUCOSE 120* 106*  BUN 14 15  CREATININE 0.82 0.88  CALCIUM 8.7* 8.0*   Liver Function Tests: Recent Labs  Lab 08/21/23 0856 08/22/23 0535  AST 32 25  ALT 26 19  ALKPHOS 82 68  BILITOT 1.4* 1.0  PROT 7.9 6.6  ALBUMIN 4.3 3.4*   CBG: No results for input(s): "GLUCAP" in the last 168 hours.  Discharge time spent: {LESS THAN/GREATER BJYN:82956} 30 minutes.  Signed: Marcelino Duster, MD [facility] 08/24/2023

## 2023-08-24 NOTE — TOC Transition Note (Signed)
Transition of Care [facility]) - CM/SW Discharge Note   Patient Details  Name: [name] [name] Peth MRN: [id number] Date of Birth: 01-24-1946  [facility] CM/SW Contact:  Darolyn Rua, LCSW Phone Number: 08/24/2023, 11:42 AM   Clinical Narrative:     Patient to discharge home today, per previous LCSW patient was agreeable to [facility] . CSW confirmed with patient, she reports no preference of agency and no dme needs as she has RW at home, [place] with [facility] able to accept. Daughter to transport at discharge today.   No additional needs.   Final next level of care: Home w Home Health Services Barriers to Discharge: No Barriers Identified   Patient Goals and CMS Choice CMS Medicare.gov Compare Post Acute Care list provided to:: Patient Choice offered to / list presented to : Patient  Discharge Placement                      Patient and family notified of of transfer: 08/24/23  Discharge Plan and Services Additional resources added to the After Visit Summary for                            [facility] Arranged: PT   Date [facility] Agency Contacted: 08/24/23   Representative spoke with at [facility] Agency: bayada cory  Social Determinants of Health (SDOH) Interventions SDOH Screenings   Food Insecurity: No Food Insecurity (10/16/2022)   Received from [facility], [facility]  Transportation Needs: No Transportation Needs (10/16/2022)   Received from [facility], [facility]  Financial Resource Strain: Low Risk  (10/16/2022)   Received from [facility], [facility]  Tobacco Use: Low Risk  (08/21/2023)     Readmission Risk Interventions     No data to display

## 2023-08-24 NOTE — Care Management Important Message (Signed)
Important Message  Patient Details  Name: [name] [name] MRN: [id number] Date of Birth: Jan 05, 1946   Important Message Given:  N/A - LOS <3 / Initial given by admissions     Olegario Messier A Ahriyah Vannest 08/24/2023, 11:29 AM

## 2023-09-08 ENCOUNTER — Emergency Department: Payer: Medicare PPO

## 2023-09-08 ENCOUNTER — Encounter: Payer: Self-pay | Admitting: Medical Oncology

## 2023-09-08 ENCOUNTER — Other Ambulatory Visit: Payer: Self-pay

## 2023-09-08 ENCOUNTER — Emergency Department
Admission: EM | Admit: 2023-09-08 | Discharge: 2023-09-08 | Disposition: A | Discharge status: Home / Self Care | Payer: Medicare PPO | Attending: Emergency Medicine | Admitting: Emergency Medicine

## 2023-09-08 DIAGNOSIS — R0789 Other chest pain: Secondary | ICD-10-CM | POA: Diagnosis present

## 2023-09-08 DIAGNOSIS — M549 Dorsalgia, unspecified: Secondary | ICD-10-CM | POA: Insufficient documentation

## 2023-09-08 LAB — CBC
HCT: 39.8 % (ref 36.0–46.0)
Hemoglobin: 13.3 g/dL (ref 12.0–15.0)
MCH: 30.8 pg (ref 26.0–34.0)
MCHC: 33.4 g/dL (ref 30.0–36.0)
MCV: 92.1 fL (ref 80.0–100.0)
Platelets: 410 10*3/uL — ABNORMAL HIGH (ref 150–400)
RBC: 4.32 MIL/uL (ref 3.87–5.11)
RDW: 13.3 % (ref 11.5–15.5)
WBC: 5.5 10*3/uL (ref 4.0–10.5)
nRBC: 0 % (ref 0.0–0.2)

## 2023-09-08 LAB — BASIC METABOLIC PANEL
Anion gap: 11 (ref 5–15)
BUN: 14 mg/dL (ref 8–23)
CO2: 24 mmol/L (ref 22–32)
Calcium: 9 mg/dL (ref 8.9–10.3)
Chloride: 105 mmol/L (ref 98–111)
Creatinine, Ser: 0.67 mg/dL (ref 0.44–1.00)
GFR, Estimated: >60 mL/min (ref >60)
Glucose, Bld: 113 mg/dL — ABNORMAL HIGH (ref 70–99)
Potassium: 3.3 mmol/L — ABNORMAL LOW (ref 3.5–5.1)
Sodium: 140 mmol/L (ref 135–145)

## 2023-09-08 LAB — HEPATIC FUNCTION PANEL
ALT: 18 U/L (ref 0–44)
AST: 27 U/L (ref 15–41)
Albumin: 3.7 g/dL (ref 3.5–5.0)
Alkaline Phosphatase: 70 U/L (ref 38–126)
Bilirubin, Direct: <0.1 mg/dL (ref 0.0–0.2)
Total Bilirubin: 0.6 mg/dL (ref 0.3–1.2)
Total Protein: 6.8 g/dL (ref 6.5–8.1)

## 2023-09-08 LAB — LIPASE, BLOOD: Lipase: 40 U/L (ref 11–51)

## 2023-09-08 LAB — TROPONIN I (HIGH SENSITIVITY)
Troponin I (High Sensitivity): 5 ng/L (ref <18)
Troponin I (High Sensitivity): 4 ng/L (ref <18)

## 2023-09-08 MED ORDER — IOHEXOL 350 MG/ML SOLN
75.0000 mL | Freq: Once | INTRAVENOUS | Status: AC | PRN
  Administered 2023-09-08: 75 mL via INTRAVENOUS

## 2023-09-08 NOTE — ED Provider Notes (Signed)
[facility] Provider Note    Event Date/Time   First MD Initiated Contact with Patient 09/08/23 [phone]     (approximate)   History   Chief Complaint: Shortness of Breath   HPI  [name] [name] is a 77 y.o. female with a history of stroke, cholelithiasis, PE with recent thrombectomy who comes to the ED c/o chest pain. She had thrombectomy 2 weeks ago, after which she was feeling better. She has been compliant with Eliquis. Last night, she developed right mid back pain, and this morning pain worsened to right lower anterior chest pain. Assoc. With sob. Hurts to move and hurts to breathe. No fever or cough. No n/v/d     Physical Exam   Triage Vital Signs: ED Triage Vitals  Encounter Vitals Group     BP 09/08/23 0907 116/62     Systolic BP Percentile --      Diastolic BP Percentile --      Pulse Rate 09/08/23 0907 79     Resp 09/08/23 0907 18     Temp 09/08/23 0907 98 F (36.7 C)     Temp Source 09/08/23 0907 Oral     SpO2 09/08/23 0907 100 %     Weight 09/08/23 0908 145 lb 8.1 oz (66 kg)     Height 09/08/23 0908 5\' 3"  (1.6 m)     Head Circumference --      Peak Flow --      Pain Score 09/08/23 0908 4     Pain Loc --      Pain Education --      Exclude from Growth Chart --     Most recent vital signs: Vitals:   09/08/23 1230 09/08/23 1300  BP: 136/84 122/78  Pulse: (!) 56 68  Resp: 14 (!) 24  Temp:    SpO2: 99% 98%    General: Awake, no distress.  CV:  Good peripheral perfusion. RRR Resp:  Normal effort. CTAB Abd:  No distention. Soft, mild ruq tenderness Other:  Pain is reproduced along the dermatome of T5 along the course of the rib, worse in right later chest wall but tenderness in a linear fashion. No rash.   ED Results / Procedures / Treatments   Labs (all labs ordered are listed, but only abnormal results are displayed) Labs Reviewed  BASIC METABOLIC PANEL - Abnormal; Notable for the following components:      Result Value    Potassium 3.3 (*)    Glucose, Bld 113 (*)    All other components within normal limits  CBC - Abnormal; Notable for the following components:   Platelets 410 (*)    All other components within normal limits  LIPASE, BLOOD  HEPATIC FUNCTION PANEL  TROPONIN I (HIGH SENSITIVITY)  TROPONIN I (HIGH SENSITIVITY)     EKG Interpreted by me Sinus rhythm, rate 74. Normal axis, intervals, qrs, ST segments, and t waves.   RADIOLOGY Chest xray interpreted by me, no airspace disease. Radiology report reviewed   PROCEDURES:  Procedures   MEDICATIONS ORDERED IN ED: Medications  iohexol (OMNIPAQUE) 350 MG/ML injection 75 mL (75 mLs Intravenous Contrast Given 09/08/23 1116)     IMPRESSION / MDM / ASSESSMENT AND PLAN / ED COURSE  I reviewed the triage vital signs and the nursing notes.  DDx: pna, ptx, pleural effusion, recurrent PE, nstemi, cholecystitis, radiculopathy, MSK pain/chest wall strain  Patient's presentation is most consistent with acute presentation with potential threat to life or bodily  function.  Pt p/w chest pain. VS normal. Lungs clear. Initial labs and CXR unremarkable. Will need to perform CTA chest to r/o recurrent large PE, [place] ruq for gallbladder. If neg, sx are attributable to chest wall / MSK pain vs radiculopathy. Offered analgesia, pt declines at this time.  ----------------------------------------- 2:46 PM on 09/08/2023 ----------------------------------------- Feeling better. CTA chest reassuring, [place] RUQ negative for cholecystitis. Serial trop normal.  Presentation c/w chest wall /MSK pain. Stable for DC. Return precautions discussed.      FINAL CLINICAL IMPRESSION(S) / ED DIAGNOSES   Final diagnoses:  Chest wall pain     Rx / DC Orders   ED Discharge Orders     None        Note:  This document was prepared using Dragon voice recognition software and may include unintentional dictation errors.   Sharman Cheek, MD 09/08/23 [phone]

## 2023-09-08 NOTE — ED Notes (Signed)
Pt up to the bedside commode. SBA. Pt back to bed. Warm blankets provided. Family at bedside.

## 2023-09-08 NOTE — ED Notes (Signed)
Pt given cup of water per ED provider approval. Family at bedside.

## 2023-09-08 NOTE — ED Notes (Signed)
US at bedside. Family at bedside. 

## 2023-09-08 NOTE — ED Triage Notes (Signed)
Pt was recently discharge from hospital after having PE with thrombectomy. Pt reports this am she began having pain under rt breast with SOB. Pt on eliquis, no missed doses.

## 2023-10-19 ENCOUNTER — Encounter: Payer: Self-pay | Admitting: Internal Medicine

## 2023-10-19 ENCOUNTER — Inpatient Hospital Stay: Payer: Medicare PPO | Attending: Internal Medicine | Admitting: Internal Medicine

## 2023-10-19 ENCOUNTER — Inpatient Hospital Stay: Payer: Medicare PPO

## 2023-10-19 VITALS — BP 126/84 | HR 80 | Temp 98.2°F | Ht 63.0 in | Wt 149.4 lb

## 2023-10-19 DIAGNOSIS — Z8673 Personal history of transient ischemic attack (TIA), and cerebral infarction without residual deficits: Secondary | ICD-10-CM | POA: Insufficient documentation

## 2023-10-19 DIAGNOSIS — I2699 Other pulmonary embolism without acute cor pulmonale: Secondary | ICD-10-CM | POA: Diagnosis not present

## 2023-10-19 DIAGNOSIS — Z7901 Long term (current) use of anticoagulants: Secondary | ICD-10-CM | POA: Diagnosis not present

## 2023-10-19 LAB — ANTITHROMBIN III: AntiThromb III Func: 110 % (ref 75–120)

## 2023-10-19 NOTE — Assessment & Plan Note (Addendum)
OCT 13th, 2024- CTA- Examination is positive for acute pulmonary embolism with bilateral segmental pulmonary artery filling defects within the lower lobes. RV to LV ratio is equal to 1.22. s/p Mechanical thrombectomy -[Dr.Dew]- currently on Eliquis. Lower extremity venous Dopplers: negative.   # Patient currently tolerating Eliquis well.  Agree with anticoagulation-to help prevent further blood clots at this time.    # Discussed with the patient that  acute PE is thought to be unprovoked given the absence of any identifiable reasons.  Discussed that would recommend a minimum of 6 to 12 months of anticoagulation.  However the duration of anticoagulation could also be indefinite as no identifiable cause was noted at this time.  Recommend checking for inheritable or acquired cause of blood clots.   # I had a long discussion with the patient regarding the risk of bleeding while on anticoagulation.  Patient counseled regarding taking at most care to avoid falls/hitting head. The patient should inform us of any bleeding episodes; or report to emergency room if any significant bleeding is noticed.   #  TIA/Stroke [in JAN 2024]-with out residual weakness/ Wallace Cullens [on plaivix/asprin]-Dr.Shah on eliquis.   Thank you Dr. Greggory Stallion for allowing me to participate in the care of your pleasant patient. Please do not hesitate to contact me with questions or concerns in the interim.   # I reviewed the blood work- with the patient in detail; also reviewed the imaging independently [as summarized above]; and with the patient in detail.  # DISPOSITION: # Labs today-ordered # follow up in 2-3 weeks- MD: NO labs- Dr.B

## 2023-10-19 NOTE — Progress Notes (Signed)
[facility] CONSULT NOTE  Patient Care Team: Rayetta Humphrey, MD as PCP - General (Family Medicine) Earna Coder, MD as Consulting Physician (Internal Medicine)  CHIEF COMPLAINTS/PURPOSE OF CONSULTATION: DVT/PE  #  Oncology History   No history exists.   IMPRESSION: 1. Examination is positive for acute pulmonary embolism with bilateral segmental pulmonary artery filling defects within the lower lobes. RV to LV ratio is equal to 1.22. 2. Trace right pleural effusion with subsegmental atelectasis and mild subpleural consolidation within the right lower lobe. 3. Coronary artery calcification. 4. Cholelithiasis. 5. Aortic Atherosclerosis (ICD10-I70.0).   Electronically Signed: By: Signa Kell M.D. On: 08/21/2023 11:19  HISTORY OF PRESENTING ILLNESS: Patient ambulating-independently. Alone.  [name] [name] 77 y.o.  female with prior history significant for stroke in January 2024 on aspirin, Plavix discontinued because of epistaxis, depression, seizure disorder, who presented to the hospital with right-sided upper abdominal pain and back pain.  He did not report any shortness of breath or chest pain but she complained of pleuritic pain mainly on the right upper abdomen and backside.    She was found to have acute pulmonary embolism with RV to LV ratio of 1.22. S/p mechanical thrombectomy to the left lower lobe pulmonary artery and right lower lobe pulmonary artery on 08/22/2023.  IV heparin drip transitioned to Eliquis.  Of note patient hospitalization was complicated by A-fib with RVR which is currently resolved.  Patient denies any recent surgeries or long rides or prolonged immobilization.  With regards risk factors: Long distance travel- > 8 hours: none Recent surgery GA; Immobilization/trauma: none Previous history of DVT/PE: none Obesity:none Smoking: none Family history: mother/brother- in lungs.   Cancer screening: s/p colonoscopy [due for 3  years];   Review of Systems  Constitutional:  Negative for chills, diaphoresis, fever, malaise/fatigue and weight loss.  HENT:  Negative for nosebleeds and sore throat.   Eyes:  Negative for double vision.  Respiratory:  Negative for cough, hemoptysis, sputum production, shortness of breath and wheezing.   Cardiovascular:  Negative for chest pain, palpitations, orthopnea and leg swelling.  Gastrointestinal:  Negative for abdominal pain, blood in stool, constipation, diarrhea, heartburn, melena, nausea and vomiting.  Genitourinary:  Negative for dysuria, frequency and urgency.  Musculoskeletal:  Negative for back pain and joint pain.  Skin: Negative.  Negative for itching and rash.  Neurological:  Negative for dizziness, tingling, focal weakness, weakness and headaches.  Endo/Heme/Allergies:  Does not bruise/bleed easily.  Psychiatric/Behavioral:  Negative for depression. The patient is not nervous/anxious and does not have insomnia.      MEDICAL HISTORY:  Past Medical History:  Diagnosis Date   Allergic genetic state    Arthritis    Cholelithiasis    Depression    Diarrhea    Fibroids    Hearing loss    Hemorrhoids    Hypothyroidism    IBS (irritable bowel syndrome)    Kidney stones    Osteoarthrosis    Plantar fasciitis    Seizure (HCC)    Stroke (HCC)    Synovitis    TIA (transient ischemic attack)     SURGICAL HISTORY: Past Surgical History:  Procedure Laterality Date   BREAST CYST ASPIRATION Left 12/14/2019   BUNIONECTOMY     COLONOSCOPY     COLONOSCOPY WITH PROPOFOL N/A 04/10/2018   Procedure: COLONOSCOPY WITH PROPOFOL;  Surgeon: Scot Jun, MD;  Location: [facility] ENDOSCOPY;  Service: Endoscopy;  Laterality: N/A;   COLONOSCOPY WITH PROPOFOL N/A 04/18/2023  Procedure: COLONOSCOPY WITH PROPOFOL;  Surgeon: Jaynie Collins, DO;  Location: [facility] ENDOSCOPY;  Service: Gastroenterology;  Laterality: N/A;   CYST EXCISION     mouth   DILATION AND CURETTAGE OF  UTERUS     EXTRACORPOREAL SHOCK WAVE LITHOTRIPSY Left 03/29/2019   Procedure: EXTRACORPOREAL SHOCK WAVE LITHOTRIPSY (ESWL);  Surgeon: Sondra Come, MD;  Location: [facility] ORS;  Service: Urology;  Laterality: Left;   HARDWARE REMOVAL     JOINT REPLACEMENT     PULMONARY THROMBECTOMY Bilateral 08/22/2023   Procedure: PULMONARY THROMBECTOMY;  Surgeon: Annice Needy, MD;  Location: [facility] INVASIVE CV LAB;  Service: Cardiovascular;  Laterality: Bilateral;   TONSILLECTOMY     WISDOM TOOTH EXTRACTION      SOCIAL HISTORY: Social History   Socioeconomic History   Marital status: Widowed    Spouse name: Not on file   Number of children: Not on file   Years of education: Not on file   Highest education level: Not on file  Occupational History   Not on file  Tobacco Use   Smoking status: Never   Smokeless tobacco: Never  Vaping Use   Vaping status: Never Used  Substance and Sexual Activity   Alcohol use: Not Currently    Alcohol/week: 1.0 standard drink of alcohol    Types: 1 Glasses of wine per week   Drug use: Never   Sexual activity: Yes  Other Topics Concern   Not on file  Social History Narrative   Not on file   Social Determinants of Health   Financial Resource Strain: Low Risk  (08/26/2023)   [facility] from [facility]   Overall Financial Resource Strain (CARDIA)    Difficulty of Paying Living Expenses: Not hard at all  Food Insecurity: No Food Insecurity (10/19/2023)   Hunger Vital Sign    Worried About Running Out of Food in the Last Year: Never true    Ran Out of Food in the Last Year: Never true  Transportation Needs: No Transportation Needs (10/19/2023)   PRAPARE - Administrator, Civil Service (Medical): No    Lack of Transportation (Non-Medical): No  Physical Activity: Not on file  Stress: Not on file  Social Connections: Not on file  Intimate Partner Violence: Not At Risk (10/19/2023)   Humiliation, Afraid, Rape, and Kick  questionnaire    Fear of Current or Ex-Partner: No    Emotionally Abused: No    Physically Abused: No    Sexually Abused: No    FAMILY HISTORY: Family History  Problem Relation Age of Onset   Ovarian cancer Mother    Clotting disorder Mother    Kidney failure Father    Breast cancer Sister 95   Clotting disorder Brother    Breast cancer Maternal Grandmother        Late 80's    ALLERGIES:  is allergic to prednisone, augmentin [amoxicillin-pot clavulanate], codeine, levofloxacin, omnicef [cefdinir], trimethoprim, erythromycin, and sulfamethoxazole-trimethoprim.  MEDICATIONS:  Current Outpatient Medications  Medication Sig Dispense Refill   ACACIA SYRUP PO 1.5 tsp per day     atorvastatin (LIPITOR) 40 MG tablet Take 40 mg by mouth daily.     Calcium Carbonate-Vit D-Min (CALCIUM 600+D PLUS MINERALS) 600-400 MG-UNIT TABS Take 1 tablet by mouth daily.     Cholecalciferol (VITAMIN D3) 50 MCG (2000 UT) capsule Take 2,000 Units by mouth in the morning and at bedtime.     Cyanocobalamin (VITAMIN B-12 CR) 1000 MCG TBCR Take 1  tablet by mouth daily.     EPINEPHrine 0.3 mg/0.3 mL IJ SOAJ injection Inject 0.3 mg into the muscle as needed for anaphylaxis.     fexofenadine (ALLEGRA) 180 MG tablet Take 180 mg by mouth daily.     levETIRAcetam (KEPPRA XR) 500 MG 24 hr tablet Take 1,000 mg by mouth daily.     meclizine (ANTIVERT) 12.5 MG tablet Take 1 tablet (12.5 mg total) by mouth 3 (three) times daily as needed for up to 9 doses for dizziness. 9 tablet 0   mometasone (NASONEX) 50 MCG/ACT nasal spray Place 2 sprays into the nose daily.     NONFORMULARY OR COMPOUNDED ITEM Inject into the muscle once a week. Compounded allergy shot.     zoledronic acid (RECLAST) 5 MG/100ML SOLN injection Inject into the vein. Every 12 months     apixaban (ELIQUIS) 5 MG TABS tablet Take 2 tablets (10 mg total) by mouth 2 (two) times daily for 6 days, THEN 1 tablet (5 mg total) 2 (two) times daily. 84 tablet 0   No  current facility-administered medications for this visit.       PHYSICAL EXAMINATION:  Vitals:   10/19/23 1349  BP: 126/84  Pulse: 80  Temp: 98.2 F (36.8 C)  SpO2: 94%   Filed Weights   10/19/23 1349  Weight: 149 lb 6.4 oz (67.8 kg)    Physical Exam Vitals and nursing note reviewed.  HENT:     Head: Normocephalic and atraumatic.     Mouth/Throat:     Pharynx: Oropharynx is clear.  Eyes:     Extraocular Movements: Extraocular movements intact.     Pupils: Pupils are equal, round, and reactive to light.  Cardiovascular:     Rate and Rhythm: Normal rate and regular rhythm.  Pulmonary:     Comments: Decreased breath sounds bilaterally.  Abdominal:     Palpations: Abdomen is soft.  Musculoskeletal:        General: Normal range of motion.     Cervical back: Normal range of motion.  Skin:    General: Skin is warm.  Neurological:     General: No focal deficit present.     Mental Status: She is alert and oriented to person, place, and time.  Psychiatric:        Behavior: Behavior normal.        Judgment: Judgment normal.      LABORATORY DATA:  I have reviewed the data as listed Lab Results  Component Value Date   WBC 5.5 09/08/2023   HGB 13.3 09/08/2023   HCT 39.8 09/08/2023   MCV 92.1 09/08/2023   PLT 410 (H) 09/08/2023   Recent Labs    08/21/23 0856 08/22/23 0535 09/08/23 0911 09/08/23 1223  NA 137 137 140  --   K 3.7 4.0 3.3*  --   CL 101 102 105  --   CO2 24 25 24   --   GLUCOSE 120* 106* 113*  --   BUN 14 15 14   --   CREATININE 0.82 0.88 0.67  --   CALCIUM 8.7* 8.0* 9.0  --   GFRNONAA >60 >60 >60  --   PROT 7.9 6.6  --  6.8  ALBUMIN 4.3 3.4*  --  3.7  AST 32 25  --  27  ALT 26 19  --  18  ALKPHOS 82 68  --  70  BILITOT 1.4* 1.0  --  0.6  BILIDIR  --   --   --  <  0.1  IBILI  --   --   --  NOT CALCULATED    RADIOGRAPHIC STUDIES: I have personally reviewed the radiological images as listed and agreed with the findings in the report. No  results found.  ASSESSMENT & PLAN:   Pulmonary embolism without acute cor pulmonale (HCC)  OCT 13th, 2024- CTA- Examination is positive for acute pulmonary embolism with bilateral segmental pulmonary artery filling defects within the lower lobes. RV to LV ratio is equal to 1.22. s/p Mechanical thrombectomy -[Dr.Dew]- currently on Eliquis. Lower extremity venous Dopplers: negative.   # Patient currently tolerating Eliquis well.  Agree with anticoagulation-to help prevent further blood clots at this time.    # Discussed with the patient that  acute PE is thought to be unprovoked given the absence of any identifiable reasons.  Discussed that would recommend a minimum of 6 to 12 months of anticoagulation.  However the duration of anticoagulation could also be indefinite as no identifiable cause was noted at this time.  Recommend checking for inheritable or acquired cause of blood clots.   # I had a long discussion with the patient regarding the risk of bleeding while on anticoagulation.  Patient counseled regarding taking at most care to avoid falls/hitting head. The patient should inform us of any bleeding episodes; or report to emergency room if any significant bleeding is noticed.   #  TIA/Stroke [in JAN 2024]-with out residual weakness/ Wallace Cullens [on plaivix/asprin]-Dr.Shah on eliquis.   Thank you Dr. Greggory Stallion for allowing me to participate in the care of your pleasant patient. Please do not hesitate to contact me with questions or concerns in the interim.   # I reviewed the blood work- with the patient in detail; also reviewed the imaging independently [as summarized above]; and with the patient in detail.  # DISPOSITION: # Labs today-ordered # follow up in 2-3 weeks- MD: NO labs- Dr.B     Earna Coder, MD 10/19/2023 2:56 PM

## 2023-10-19 NOTE — Progress Notes (Signed)
 No questions/concerns today.

## 2023-10-20 LAB — PROTEIN C ACTIVITY: Protein C Activity: 126 % (ref 73–180)

## 2023-10-20 LAB — PROTEIN S ACTIVITY: Protein S Activity: 87 % (ref 63–140)

## 2023-10-21 LAB — ANTIPHOSPHOLIPID SYNDROME PROF
Anticardiolipin IgG: <9 [GPL'U]/mL (ref 0–14)
Anticardiolipin IgM: 13 [MPL'U]/mL — ABNORMAL HIGH (ref 0–12)
DRVVT: 59.3 s — ABNORMAL HIGH (ref 0.0–47.0)
PTT Lupus Anticoagulant: 36.8 s (ref 0.0–43.5)

## 2023-10-21 LAB — BETA-2-GLYCOPROTEIN I ABS, IGG/M/A
Beta-2 Glyco I IgG: <9 GPI IgG units (ref 0–20)
Beta-2-Glycoprotein I IgA: <9 GPI IgA units (ref 0–25)
Beta-2-Glycoprotein I IgM: <9 GPI IgM units (ref 0–32)

## 2023-10-21 LAB — DRVVT MIX: dRVVT Mix: 46.3 s — ABNORMAL HIGH (ref 0.0–40.4)

## 2023-10-21 LAB — DRVVT CONFIRM: dRVVT Confirm: 1 {ratio} (ref 0.8–1.2)

## 2023-10-24 LAB — PROTHROMBIN GENE MUTATION

## 2023-10-24 LAB — FACTOR 5 LEIDEN

## 2023-11-15 ENCOUNTER — Inpatient Hospital Stay: Payer: Medicare PPO | Attending: Internal Medicine | Admitting: Internal Medicine

## 2023-11-15 ENCOUNTER — Encounter: Payer: Self-pay | Admitting: Internal Medicine

## 2023-11-15 VITALS — BP 122/72 | HR 72 | Temp 97.9°F | Ht 63.0 in | Wt 150.0 lb

## 2023-11-15 DIAGNOSIS — K8020 Calculus of gallbladder without cholecystitis without obstruction: Secondary | ICD-10-CM | POA: Diagnosis not present

## 2023-11-15 DIAGNOSIS — J90 Pleural effusion, not elsewhere classified: Secondary | ICD-10-CM | POA: Diagnosis not present

## 2023-11-15 DIAGNOSIS — I700 Atherosclerosis of aorta: Secondary | ICD-10-CM | POA: Insufficient documentation

## 2023-11-15 DIAGNOSIS — Z7901 Long term (current) use of anticoagulants: Secondary | ICD-10-CM | POA: Diagnosis not present

## 2023-11-15 DIAGNOSIS — I2510 Atherosclerotic heart disease of native coronary artery without angina pectoris: Secondary | ICD-10-CM | POA: Insufficient documentation

## 2023-11-15 DIAGNOSIS — Z79899 Other long term (current) drug therapy: Secondary | ICD-10-CM | POA: Diagnosis not present

## 2023-11-15 DIAGNOSIS — I2699 Other pulmonary embolism without acute cor pulmonale: Secondary | ICD-10-CM | POA: Diagnosis not present

## 2023-11-15 NOTE — Progress Notes (Signed)
 [facility] CONSULT NOTE  Patient Care Team: Zachary Idelia LABOR, MD as PCP - General (Family Medicine) Rennie Cindy SAUNDERS, MD as Consulting Physician (Internal Medicine)  CHIEF COMPLAINTS/PURPOSE OF CONSULTATION: DVT/PE  #  Oncology History   No history exists.   IMPRESSION: 1. Examination is positive for acute pulmonary embolism with bilateral segmental pulmonary artery filling defects within the lower lobes. RV to LV ratio is equal to 1.22. 2. Trace right pleural effusion with subsegmental atelectasis and mild subpleural consolidation within the right lower lobe. 3. Coronary artery calcification. 4. Cholelithiasis. 5. Aortic Atherosclerosis (ICD10-I70.0).   Electronically Signed: By: Waddell Calk M.D. On: 08/21/2023 11:19  HISTORY OF PRESENTING ILLNESS: Patient ambulating-independently. Alone.  [name] [name] 78 y.o.  female with prior history bilateral PE [August 22, 2023]-status post mechanical thrombectomy-on Eliquis ; prior history of stroke-on aspirin is here for follow-up/review results of blood work.  c/o joint pain 6/10. PCP has her holding the Lipitor to see if it helps with her joint pain. She states it hasn't made any difference in the pain.  No bleeding  noted. No falls.   Review of Systems  Constitutional:  Negative for chills, diaphoresis, fever, malaise/fatigue and weight loss.  HENT:  Negative for nosebleeds and sore throat.   Eyes:  Negative for double vision.  Respiratory:  Negative for cough, hemoptysis, sputum production, shortness of breath and wheezing.   Cardiovascular:  Negative for chest pain, palpitations, orthopnea and leg swelling.  Gastrointestinal:  Negative for abdominal pain, blood in stool, constipation, diarrhea, heartburn, melena, nausea and vomiting.  Genitourinary:  Negative for dysuria, frequency and urgency.  Musculoskeletal:  Negative for back pain and joint pain.  Skin: Negative.  Negative for itching and rash.   Neurological:  Negative for dizziness, tingling, focal weakness, weakness and headaches.  Endo/Heme/Allergies:  Does not bruise/bleed easily.  Psychiatric/Behavioral:  Negative for depression. The patient is not nervous/anxious and does not have insomnia.      MEDICAL HISTORY:  Past Medical History:  Diagnosis Date   Allergic genetic state    Arthritis    Cholelithiasis    Depression    Diarrhea    Fibroids    Hearing loss    Hemorrhoids    Hypothyroidism    IBS (irritable bowel syndrome)    Kidney stones    Osteoarthrosis    Plantar fasciitis    Seizure (HCC)    Stroke (HCC)    Synovitis    TIA (transient ischemic attack)     SURGICAL HISTORY: Past Surgical History:  Procedure Laterality Date   BREAST CYST ASPIRATION Left 12/14/2019   BUNIONECTOMY     COLONOSCOPY     COLONOSCOPY WITH PROPOFOL  N/A 04/10/2018   Procedure: COLONOSCOPY WITH PROPOFOL ;  Surgeon: Viktoria Lamar DASEN, MD;  Location: [facility] ENDOSCOPY;  Service: Endoscopy;  Laterality: N/A;   COLONOSCOPY WITH PROPOFOL  N/A 04/18/2023   Procedure: COLONOSCOPY WITH PROPOFOL ;  Surgeon: Onita Elspeth Sharper, DO;  Location: [facility] ENDOSCOPY;  Service: Gastroenterology;  Laterality: N/A;   CYST EXCISION     mouth   DILATION AND CURETTAGE OF UTERUS     EXTRACORPOREAL SHOCK WAVE LITHOTRIPSY Left 03/29/2019   Procedure: EXTRACORPOREAL SHOCK WAVE LITHOTRIPSY (ESWL);  Surgeon: Francisca Redell BROCKS, MD;  Location: [facility] ORS;  Service: Urology;  Laterality: Left;   HARDWARE REMOVAL     JOINT REPLACEMENT     PULMONARY THROMBECTOMY Bilateral 08/22/2023   Procedure: PULMONARY THROMBECTOMY;  Surgeon: Marea Selinda RAMAN, MD;  Location: [facility] INVASIVE CV  LAB;  Service: Cardiovascular;  Laterality: Bilateral;   TONSILLECTOMY     WISDOM TOOTH EXTRACTION      SOCIAL HISTORY: Social History   Socioeconomic History   Marital status: Widowed    Spouse name: Not on file   Number of children: Not on file   Years of education: Not on file   Highest  education level: Not on file  Occupational History   Not on file  Tobacco Use   Smoking status: Never   Smokeless tobacco: Never  Vaping Use   Vaping status: Never Used  Substance and Sexual Activity   Alcohol use: Not Currently    Alcohol/week: 1.0 standard drink of alcohol    Types: 1 Glasses of wine per week   Drug use: Never   Sexual activity: Yes  Other Topics Concern   Not on file  Social History Narrative   Not on file   Social Drivers of Health   Financial Resource Strain: Low Risk  (10/26/2023)   Received from [facility]   Overall Financial Resource Strain (CARDIA)    Difficulty of Paying Living Expenses: Not hard at all  Food Insecurity: No Food Insecurity (10/26/2023)   Received from [facility]   Hunger Vital Sign    Worried About Running Out of Food in the Last Year: Never true    Ran Out of Food in the Last Year: Never true  Transportation Needs: No Transportation Needs (10/26/2023)   Received from [facility] - Transportation    In the past 12 months, has lack of transportation kept you from medical appointments or from getting medications?: No    Lack of Transportation (Non-Medical): No  Physical Activity: Not on file  Stress: Not on file  Social Connections: Not on file  Intimate Partner Violence: Not At Risk (10/19/2023)   Humiliation, Afraid, Rape, and Kick questionnaire    Fear of Current or Ex-Partner: No    Emotionally Abused: No    Physically Abused: No    Sexually Abused: No    FAMILY HISTORY: Family History  Problem Relation Age of Onset   Ovarian cancer Mother    Clotting disorder Mother    Kidney failure Father    Breast cancer Sister 59   Clotting disorder Brother    Breast cancer Maternal Grandmother        Late 80's    ALLERGIES:  is allergic to prednisone, augmentin [amoxicillin-pot clavulanate], codeine, levofloxacin, omnicef [cefdinir], trimethoprim ,  erythromycin, and sulfamethoxazole -trimethoprim .  MEDICATIONS:  Current Outpatient Medications  Medication Sig Dispense Refill   ACACIA SYRUP PO 1.5 tsp per day     Calcium  Carbonate-Vit D-Min (CALCIUM  600+D PLUS MINERALS) 600-400 MG-UNIT TABS Take 1 tablet by mouth daily.     Cholecalciferol (VITAMIN D3) 50 MCG (2000 UT) capsule Take 2,000 Units by mouth in the morning and at bedtime.     Cyanocobalamin (VITAMIN B-12 CR) 1000 MCG TBCR Take 1 tablet by mouth daily.     EPINEPHrine  0.3 mg/0.3 mL IJ SOAJ injection Inject 0.3 mg into the muscle as needed for anaphylaxis.     fexofenadine (ALLEGRA) 180 MG tablet Take 180 mg by mouth daily.     levETIRAcetam  (KEPPRA  XR) 500 MG 24 hr tablet Take 1,000 mg by mouth daily.     meclizine  (ANTIVERT ) 12.5 MG tablet Take 1 tablet (12.5 mg total) by mouth 3 (three) times daily as needed for up to 9 doses for dizziness. 9  tablet 0   mometasone (NASONEX) 50 MCG/ACT nasal spray Place 2 sprays into the nose daily.     NONFORMULARY OR COMPOUNDED ITEM Inject into the muscle once a week. Compounded allergy shot.     zoledronic acid (RECLAST) 5 MG/100ML SOLN injection Inject into the vein. Every 12 months     apixaban  (ELIQUIS ) 5 MG TABS tablet Take 2 tablets (10 mg total) by mouth 2 (two) times daily for 6 days, THEN 1 tablet (5 mg total) 2 (two) times daily. 84 tablet 0   atorvastatin  (LIPITOR) 40 MG tablet Take 40 mg by mouth daily. (Patient not taking: Reported on 11/15/2023)     No current facility-administered medications for this visit.       PHYSICAL EXAMINATION:  Vitals:   11/15/23 0950  BP: 122/72  Pulse: 72  Temp: 97.9 F (36.6 C)  SpO2: 100%    Filed Weights   11/15/23 0950  Weight: 150 lb (68 kg)     Physical Exam Vitals and nursing note reviewed.  HENT:     Head: Normocephalic and atraumatic.     Mouth/Throat:     Pharynx: Oropharynx is clear.  Eyes:     Extraocular Movements: Extraocular movements intact.     Pupils: Pupils  are equal, round, and reactive to light.  Cardiovascular:     Rate and Rhythm: Normal rate and regular rhythm.  Pulmonary:     Comments: Decreased breath sounds bilaterally.  Abdominal:     Palpations: Abdomen is soft.  Musculoskeletal:        General: Normal range of motion.     Cervical back: Normal range of motion.  Skin:    General: Skin is warm.  Neurological:     General: No focal deficit present.     Mental Status: She is alert and oriented to person, place, and time.  Psychiatric:        Behavior: Behavior normal.        Judgment: Judgment normal.      LABORATORY DATA:  I have reviewed the data as listed Lab Results  Component Value Date   WBC 5.5 09/08/2023   HGB 13.3 09/08/2023   HCT 39.8 09/08/2023   MCV 92.1 09/08/2023   PLT 410 (H) 09/08/2023   Recent Labs    08/21/23 0856 08/22/23 0535 09/08/23 0911 09/08/23 1223  NA 137 137 140  --   K 3.7 4.0 3.3*  --   CL 101 102 105  --   CO2 24 25 24   --   GLUCOSE 120* 106* 113*  --   BUN 14 15 14   --   CREATININE 0.82 0.88 0.67  --   CALCIUM  8.7* 8.0* 9.0  --   GFRNONAA >60 >60 >60  --   PROT 7.9 6.6  --  6.8  ALBUMIN 4.3 3.4*  --  3.7  AST 32 25  --  27  ALT 26 19  --  18  ALKPHOS 82 68  --  70  BILITOT 1.4* 1.0  --  0.6  BILIDIR  --   --   --  <0.1  IBILI  --   --   --  NOT CALCULATED    RADIOGRAPHIC STUDIES: I have personally reviewed the radiological images as listed and agreed with the findings in the report. No results found.  ASSESSMENT & PLAN:   Pulmonary embolism without acute cor pulmonale (HCC)  OCT 13th, 2024-[right upper quadrant pain ] CTA- Examination is positive for acute  pulmonary embolism with bilateral segmental pulmonary artery filling defects within the lower lobes. RV to LV ratio is equal to 1.22. s/p Mechanical thrombectomy -[Dr.Dew]- currently on Eliquis  [PCP]. Lower extremity venous Dopplers: negative.  Tolerating well.  Hypercoagulable workup [December 2024]-negative for  factor V Leiden/prothrombin gene mutation/Antithrombin III  protein C and protein S; antiphospholipid antibody workup negative.   # Given the unprovoked nature of PE -discussed that the length of anticoagulation would be 6 to 12 months [history of nosebleeds on Plavix]; However also discussed the possibility of duration of anticoagulation could also be indefinite as no identifiable cause was noted at this time.  Consider decreasing the dose to 2.5 mg twice daily Eliquis  after 1 year.   #  TIA/Stroke [in JAN 2024]-with out residual weakness/ Seizures-keppra  [on plaivix/asprin]-Dr.Shah on eliquis .   # I reviewed the blood work- with the patient in detail; also reviewed the imaging independently [as summarized above]; and with the patient in detail.  # DISPOSITION: # follow up in 6 months MD: labs- cbc/bmp-- Dr.B     Cindy JONELLE Joe, MD 11/15/2023 10:15 AM

## 2023-11-15 NOTE — Progress Notes (Signed)
 C/o joint pain 6/10. PCP has her holding the Lipitor to see if it helps with her joint pain. She states it hasn't made any difference in the pain.

## 2023-11-15 NOTE — Addendum Note (Signed)
 Addended by: Darrold Span A on: 11/15/2023 10:17 AM   Modules accepted: Orders

## 2023-11-15 NOTE — Assessment & Plan Note (Addendum)
 OCT 13th, 2024-[right upper quadrant pain ] CTA- Examination is positive for acute pulmonary embolism with bilateral segmental pulmonary artery filling defects within the lower lobes. RV to LV ratio is equal to 1.22. s/p Mechanical thrombectomy -[Dr.Dew]- currently on Eliquis  [PCP]. Lower extremity venous Dopplers: negative.  Tolerating well.  Hypercoagulable workup [December 2024]-negative for factor V Leiden/prothrombin gene mutation/Antithrombin III  protein C and protein S; antiphospholipid antibody workup negative.   # Given the unprovoked nature of PE -discussed that the length of anticoagulation would be 6 to 12 months [history of nosebleeds on Plavix]; However also discussed the possibility of duration of anticoagulation could also be indefinite as no identifiable cause was noted at this time.  Consider decreasing the dose to 2.5 mg twice daily Eliquis  after 1 year.   #  TIA/Stroke [in JAN 2024]-with out residual weakness/ Seizures-keppra  [on plaivix/asprin]-Dr.Shah on eliquis .   # I reviewed the blood work- with the patient in detail; also reviewed the imaging independently [as summarized above]; and with the patient in detail.  # DISPOSITION: # follow up in 6 months MD: labs- cbc/bmp-- Dr.B

## 2023-12-12 NOTE — Progress Notes (Signed)
 [name], [name] 12/12/2023 DOB: 1946/01/25  Age: 78 y.o. [facility]  Referring Physician: George, Sionne Sherita Edu, MD [address] [place],  [place] [address]  Primary Care Physician: [name] Gulling Sherita Edu, MD [address] [place] [address] [phone]  PATIENT IDENTIFICATION  [name] [name] is a 78 y.o. female with a history of acute pulmonary embolism on 08/21/23 treated with mechanical thrombectomy at [facility] who presents for evaluation.  PROBLEM LIST   Problem List  Date Reviewed: 11/30/2023          ICD-10-CM Priority Class Noted - Resolved Diagnosed   Paroxysmal atrial fibrillation with RVR (CMS/HHS-HCC) I48.0   08/22/2023 - Present    Overview Signed 12/12/2023  9:46 AM by [name] Oppenheim, MD    She had rapid A-fib during pulmonary thrombectomy. Patient recalls a history of paroxysmal atrial fibrillation sometime in September 2023 but she converted to normal rhythm spontaneously without any medications.       Pulmonary emboli (CMS/HHS-HCC) I26.99   08/21/2023 - Present    Overview Addendum 12/12/2023  9:51 AM by [name] Oppenheim, MD    08/21/23 at [facility] - right-sided upper abdominal pain and back pain. He did not report any shortness of breath or chest pain but she complained of pleuritic pain mainly on the right upper abdomen and backside. No recent travel or surgeries. She was found to have acute pulmonary embolism with RV to LV ratio of 1.22. Treated with aspiration thrombectomy on 08/22/23. Discharged on apixaban .  Data: - CT PE 08/21/23: 1. Examination is positive for acute pulmonary embolism with  bilateral segmental pulmonary artery filling defects within the  lower lobes. RV to LV ratio is equal to 1.22.  2. Trace right pleural effusion with subsegmental atelectasis and  mild subpleural consolidation within the right lower lobe.  3. Coronary artery calcification.  4. Cholelithiasis.  5. Aortic  Atherosclerosis (ICD10-I70.0).  - TTE 08/21/23:  1. Left ventricular ejection fraction, by estimation, is 60 to 65%. The left ventricle has normal function. The left ventricle has no regional wall motion abnormalities. Left ventricular diastolic parameters are consistent with Grade I diastolic  dysfunction (impaired relaxation).   2. Right ventricular systolic function is normal. The right ventricular size is normal.   3. The mitral valve is normal in structure. Trivial mitral valve regurgitation. No evidence of mitral stenosis.   4. The aortic valve is normal in structure. Aortic valve regurgitation is not visualized. No aortic stenosis is present.   5. The inferior vena cava is normal in size with greater than 50% respiratory variability, suggesting right atrial pressure of 3 mmHg.  - Aspiration thrombectomy 08/22/23: Mechanical thrombectomy to the left lower lobe pulmonary artery and right lower lobe pulmonary artery. 1. Contrast injection right heart 2. Mechanical thrombectomy to the left lower lobe pulmonary artery and right lower lobe pulmonary artery using the penumbra CAT 12 catheter 4. Selective catheter placement right lower lobe, middle lobe, and upper lobe pulmonary arteries 5. Selective catheter placement left lower lobe and upper lobe pulmonary arteries  - BLE US  08/23/23: No  lower extremity DVT.  - CT PE 09/08/23: IMPRESSION:  *No evidence of embolism to the proximal subsegmental pulmonary  artery level. There is clearing of previously seen pulmonary  embolism in the bilateral lower lobe segmental pulmonary artery  branches.  *There are patchy areas of atelectasis/scarring in the bilateral  lungs. No lung mass, consolidation, pleural effusion or  pneumothorax.  *Multiple other nonacute observations,  as described above.        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   06/06/2023 - Present    NASH (nonalcoholic steatohepatitis) K75.81   06/06/2023 - Present    Prediabetes R73.03   06/06/2023 - Present    Meningioma (CMS/HHS-HCC) D32.9   12/17/2022 - Present    History of stroke Z86.73   12/16/2022 - Present    Overview Signed 12/12/2023  9:43 AM by [name] Oppenheim, MD    January 2024      Transient ischemic attack G45.9   11/08/2022 - Present    Bilateral hearing loss H91.93   03/28/2019 - Present    Nephrolithiasis N20.0   03/28/2019 - Present    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 Patient declines starting a Bisphosphonate or other Osteoporotic therapies.      Cholelithiasis without cholecystitis K80.20   04/02/2015 - Present    Chronic diarrhea K52.9   Unknown - Present    Nephrolithiasis N20.0   Unknown - Present    Bilateral hearing loss H91.93   Unknown - Present    RESOLVED: Seizure-like activity (CMS/HHS-HCC) R56.9   12/16/2022 - 11/24/2023     INTERVAL HISTORY  [name] [name] presents for consultation of pulmonary embolism..   Briefly, the patient presented in October 2024 with pulmonary embolism noted on CT and underwent mechanical thrombectomy.  The patient denies any prior blood clot history to this.  DVTs were negative.  Course was complicated by atrial fibrillation with RVR which she self converted.  Patient has documented history of paroxysmal A-fib dating back a couple years now but has not been on anticoagulation or rate or rhythm control.  In January 2024 she had a stroke and since then has been recovering largely doing physical therapy at home and now in the outpatient setting.  It appears  that her PE was not provoked that can be discerned.  She states that since then she has been doing quite well with mild limitations in her breathing but is able to walk around in her home at a slow pace without much trouble.  Largely her ambulatory efforts are limited by pain she is followed by rheumatology they are wondering whether she has PMR and were considering treatment with prednisone.  The patient prefers not to do this and has been doing acupuncture and alternative therapies for the management.  Of note the patient would like to move back to [place]  soon.  She is tolerating Eliquis  without any bleeding.  She was on aspirin and Plavix for CVA TIA in the past and is now currently only on apixaban .  REVIEW OF SYSTEMS  Notable for joint pain and shortness of breath. All others negative.  CURRENT MEDICATIONS:   Current Outpatient Medications  Medication Sig Dispense Refill  . acacia, bulk, Powd Use once daily    . acetaminophen  (TYLENOL  ORAL) Take 1,000 mg by mouth 2 (two) times daily    . apixaban  (ELIQUIS ) 5 mg tablet Take 1 tablet (5 mg total)  by mouth every 12 (twelve) hours 60 tablet 3  . atorvastatin  (LIPITOR) 40 MG tablet Take 1 tablet (40 mg total) by mouth once daily 90 tablet 3  . calcium  carbonate-vit D3-min 600 mg calcium - 400 unit Tab Take 1 tablet by mouth once daily    . cholecalciferol 1000 unit tablet Take 1 tablet by mouth 2 (two) times daily    . CYANOCOBALAMIN, VITAMIN B-12, (VITAMIN B-12 ORAL) Take 1 tablet by mouth daily.    . EPINEPHrine  (EPIPEN ) 0.3 mg/0.3 mL auto-injector Inject 0.3 mg into the muscle as needed    . fexofenadine HCl (ALLEGRA ORAL) Take by mouth    . fluticasone propionate (FLONASE) 50 mcg/actuation nasal spray Place 2 sprays into both nostrils 2 (two) times daily    . Herbal Supplement once daily Juice plus supplement  3 capsules twice daily.    . L.acid/L.casei/B.bif/B.lon/FOS (PROBIOTIC BLEND ORAL) Take 1 tablet by mouth once daily    .  levETIRAcetam  (KEPPRA  XR) 500 mg XR tablet Take 2 tablets (1,000 mg total) by mouth at bedtime for 30 days (Patient taking differently: Take 500 mg by mouth at bedtime Weaning off-last dose will 12/26/23) 60 tablet 0  . meclizine  (ANTIVERT ) 25 mg tablet Take 25 mg by mouth 3 (three) times daily as needed for Dizziness    . mometasone (NASONEX) 50 mcg/actuation nasal spray Place 1 spray into both nostrils 2 (two) times daily    . methylPREDNISolone  (MEDROL ) 4 MG tablet Take 4 tablets (16 mg total) by mouth once daily for 7 days, THEN 3 tablets (12 mg total) once daily for 7 days, THEN 2 tablets (8 mg total) once daily for 7 days, THEN 1 tablet (4 mg total) once daily for 7 days. (Patient not taking: Reported on 12/12/2023) 70 tablet 0   No current facility-administered medications for this visit.    ALLERGIES   Allergies  Allergen Reactions  . Amoxicillin-Pot Clavulanate Nausea And Vomiting  . Codeine Unknown    'feel really strange' no n/v  . Sulfamethoxazole  Diarrhea  . Trimethoprim  Other (See Comments)  . Erythromycin Diarrhea  . Levofloxacin Hives  . Omnicef [Cefdinir] Diarrhea  . Other Vomiting    CERTAIN ANTIBIOTICS- PT UNAWARE OF NAMES  . Prednisone Vomiting    PAST MEDICAL HISTORY   Past Medical History:  Diagnosis Date  . Allergic state   . Arthritis   . Bilateral hearing loss    Wears hearing aids  . Cholelithiasis    asymptomatic  . Cholelithiasis without cholecystitis 04/02/2015  . Chronic diarrhea   . Environmental allergies   . Fibroids   . Hemorrhoids   . History of depression   . Hypothyroidism   . IBS (irritable bowel syndrome)   . Irritable bowel syndrome with diarrhea 01/13/2018  . Joint synovitis    Likely synovitis, second metatarsophalangeal joint  . Nephrolithiasis 2005  . Osteoporosis 04/02/2015  . Plantar fasciitis, right   . Seizure-like activity (CMS/HHS-HCC) 12/16/2022   Past Surgical History:  Procedure Laterality Date  . TONSILLECTOMY AND  ADENOIDECTOMY  1958  . Excision of wisdom teeth  1965  . Right foot surgery  06/01/2006   hallux valgus, fifth metatarsal valgus and hammertoe correction  . Hardware removal  08/12/2006   Painful retained hardware, right fifth MTPJ  . COLONOSCOPY  11/27/2007   Int Hemorrhoids: CBF 11/2017; Recall Ltr mailed 10/28/2017 (dh)  . COLONOSCOPY  04/10/2018   Internal Hemorrhoids: CBF 04/2028  . Colon @ [facility]  04/18/2023   (  5)Tubular adenomas/Repeat 70yrs if medically appropriate given age/SMR  . SIMPLE EXERCISE TEST(6 MIN WALK)  12/12/2023  .  dilatation and curettage     For miscarriage x3  . Bunionectomy Right   . COLONOSCOPY    . D&C, hysteroscopy, polypectomy  08/18/2006 - Dr. Janit  . JOINT REPLACEMENT  06/04/2014 and 11/12/14   Metacarpal joint replaced in left and right thumbs  . Removal of cyst from mouth      SOCIAL HISTORY   Social History   Socioeconomic History  . Marital status: Widowed  Tobacco Use  . Smoking status: Never    Passive exposure: Never  . Smokeless tobacco: Never  Vaping Use  . Vaping status: Never Used  Substance and Sexual Activity  . Alcohol use: Not Currently    Comment: social  . Drug use: No  . Sexual activity: Not Currently    Partners: Male    Birth control/protection: None  Social History Narrative   10/31/2023   Likes/Enjoys/What fills your day?:    Military Service: None   Driving Status: Reports driving, independently   Home: One Story   Your Bedrooms is on: First Level   Fewest Steps to enter the home: 0   Other persons in the home: no one   Pets: none   Medical equipment you use daily: None   Medical equipment available in the home: None   Dental: no significant dental problems. Last screening Date: July 2024. Screening is: Up to date, Dr. Charlena Ling   Vision: Wears prescription glasses. Last screening Date: November 2024. Screening is: Up to date, Dr. Mevelyn   Hearing: Bilateral Hearing Aids. Last screening Date: October 2024.  Screening is: Up to date, Dr. Herminio   Dermatology: Denies areas of concern. Last screening Date: June 2024 Screening is: Up to date, [facility]       Social Drivers of Health   Financial Resource Strain: Low Risk  (10/26/2023)   Overall Financial Resource Strain (CARDIA)   . Difficulty of Paying Living Expenses: Not hard at all  Food Insecurity: No Food Insecurity (10/26/2023)   Hunger Vital Sign   . Worried About Programme researcher, broadcasting/film/video in the Last Year: Never true   . Ran Out of Food in the Last Year: Never true  Transportation Needs: No Transportation Needs (10/26/2023)   PRAPARE - Transportation   . Lack of Transportation (Medical): No   . Lack of Transportation (Non-Medical): No  Housing Stability: Low Risk  (12/07/2023)   Housing Stability Vital Sign   . Unable to Pay for Housing in the Last Year: No   . Number of Times Moved in the Last Year: 0   . Homeless in the Last Year: No    FAMILY HISTORY   Family History  Problem Relation Name Age of Onset  . Uterine cancer Mother  20       endometrial cancer stage 3  . Coronary Artery Disease (Blocked arteries around heart) Mother    . Osteoarthritis Mother    . Osteoporosis (Thinning of bones) Mother    . Coronary Artery Disease (Blocked arteries around heart) Father    . Kidney failure Father    . Prostate cancer Father    . Skin cancer Father    . Osteoporosis (Thinning of bones) Sister    . Parkinsonism Sister    . Breast cancer Sister    . Osteoporosis (Thinning of bones) Sister    . No Known Problems Brother    .  Colon cancer Maternal Uncle    . Breast cancer Paternal Aunt         77s  . No Known Problems Maternal Grandmother    . No Known Problems Maternal Grandfather    . Myocardial Infarction (Heart attack) Paternal Grandmother    . Breast cancer Paternal Grandmother    . Dementia Paternal Grandmother    . Huntington's disease Paternal Grandfather     PHYSICAL EXAM   Vitals:   12/12/23 0827  BP:  124/81  Pulse: 64  Resp: 16  Temp: 36.7 C (98 F)  TempSrc: Oral  SpO2: 96%  Weight: 68.3 kg (150 lb 9.2 oz)  Height: 157.5 cm (5' 2)  PainSc:   5  PainLoc: Shoulder   General: This is a female in no apparent distress. HEENT: Pupils equal and reactive to light, extraocular movements intact. No oropharyngeal erythema or exudates noted. NECK: JVP is flat. No hepatojugular reflux CV: Regular rate and rhythm, normal S1 + S2, no murmur appreciated. No RV heave or gallop noted. PULM: Clear to auscultation bilaterally. ABD: soft, nontender/nondistended. EXT: No lower extremity edema noted. NEURO: Alert and oriented. No gross defects noted. PSYCH: Appropriate mood and affect.  LABORATORY DATA:   Results for orders placed or performed in visit on 12/12/23  Complete Blood Count (CBC)  Result Value Ref Range   WBC (White Blood Cell Count) 8.5 3.2 - 9.8 x10^9/L   Hemoglobin 13.2 11.7 - 15.5 g/dL   Hematocrit 58.1 64.9 - 45.0 %   Platelets 293 150 - 450 x10^9/L   MCV (Mean Corpuscular Volume) 96 80 - 98 fL   MCH (Mean Corpuscular Hemoglobin) 30.2 26.5 - 34.0 pg   MCHC (Mean Corpuscular Hemoglobin Concentration) 31.6 31.0 - 36.0 %   RBC (Red Blood Cell Count) 4.37 3.77 - 5.16 x10^12/L   RDW-CV (Red Cell Distribution Width) 13.3 11.5 - 14.5 %   NRBC (Nucleated Red Blood Cell Count) 0.00 0 x10^9/L   NRBC % (Nucleated Red Blood Cell %) 0.0 %   MPV (Mean Platelet Volume) 10.3 7.2 - 11.7 fL  Comprehensive Metabolic Panel (CMP)  Result Value Ref Range   Sodium 143 135 - 145 mmol/L   Potassium 4.8 3.5 - 5.0 mmol/L   Chloride 106 98 - 108 mmol/L   Carbon Dioxide (CO2) 27 21 - 30 mmol/L   Urea Nitrogen (BUN) 17 7 - 20 mg/dL   Creatinine 0.8 0.4 - 1.0 mg/dL   Glucose 99 70 - 859 mg/dL   Calcium  9.3 8.7 - 10.2 mg/dL   AST (Aspartate Aminotransferase) 34 15 - 41 U/L   ALT (Alanine Aminotransferase) 28 10 - 39 U/L   Bilirubin, Total 0.7 0.4 - 1.5 mg/dL   Alk Phos (Alkaline Phosphatase) 71  24 - 110 U/L   Albumin 3.8 3.5 - 4.8 g/dL   Protein, Total 7.4 6.2 - 8.1 g/dL   Anion Gap 10 3 - 12 mmol/L   BUN/CREA Ratio 21 6 - 27   Glomerular Filtration Rate (eGFR)  75 mL/min/1.73sq m  Pro-Brain Natriuretic peptide, N-Terminal (NT-pro-BNP)  Result Value Ref Range   Pro-Brain Natriuretic Peptide, N-terminal (NT-Pro-BNP) 182 <=625 pg/mL   ANCILLARY DATA:  I personally reviewed and interpreted the results of the six minute walk test. today was 453 meters with a heart rate increase from 72 to 100 and oxygen saturation of 95% to 97% on RA.  Outside CT scans were reviewed.  08/21/2023 CT scan with bilateral segmental pulmonary artery filling defects  within the lower lobes.  And a repeat CT scan on 09/08/2023 showed no evidence in the proximal subsegmental pulmonary artery level.  There was note of clearing of the pulmonary embolism in the bilateral lower lobe segmental pulmonary artery branches in comparison.  Outside echocardiogram with EF greater than 55% with normal left and right ventricular function.  VQ scan with very low probability for pulmonary embolism.  PFTs were within normal range.  I personally reviewed and interpreted the results of his earlier studies listed in the problem list. ASSESSMENT AND PLAN:   [name] [name] is a 78 y.o. female with history of pulmonary embolism with mechanical thrombectomy.. Records reviewed and entered into the Problem List.  # Pulmonary embolism # Hx of afib Has 2 risk factors for thromboembolic disease with history of unprovoked PE and paroxysmal A-fib.  She has had TIA and CVA in the past as well.  There are comments about paroxysmal atrial fibrillation in the past with the patient not been on anticoagulation. - continue apixaban  5 mg BID indefinitely  Alm HERO. Viktoria, MD, PhD [facility] Transplant Cardiology Fellow   Patient Instructions  Everything looks good on the scan of your lungs. Continue on the apixaban   indefinitely. Give us  a call with any questions.  # Disposition. Return if symptoms worsen or fail to improve.  The plan was discussed with the patient and there were no barriers to understanding.  I spent a total of 60 minutes in both face-to-face and non-face-to-face activities, excluding procedures performed, for this visit on the date of this encounter.  I personally saw and evaluated the patient, and participated in the management and treatment plan as documented in the resident/fellow note.  Bertrand Rosales, MD, PhD Associate Professor in Cardiology [facility]  This note was dictated using voice-recognition software and typed without the aid of a medical transcriptionist. Please excuse any errors or typos.

## 2024-03-09 ENCOUNTER — Other Ambulatory Visit: Payer: Self-pay | Admitting: Family Medicine

## 2024-03-09 DIAGNOSIS — Z1231 Encounter for screening mammogram for malignant neoplasm of breast: Secondary | ICD-10-CM

## 2024-03-13 ENCOUNTER — Ambulatory Visit
Admission: RE | Admit: 2024-03-13 | Discharge: 2024-03-13 | Disposition: A | Discharge status: Home / Self Care | Source: Ambulatory Visit | Attending: Family Medicine | Admitting: Family Medicine

## 2024-03-13 DIAGNOSIS — Z1231 Encounter for screening mammogram for malignant neoplasm of breast: Secondary | ICD-10-CM | POA: Insufficient documentation

## 2024-03-14 NOTE — Progress Notes (Signed)
 Chief Complaint  Patient presents with  . Polyarthralgia    History of present illness:    [name] [name] is a 78 y.o.female who presents today for follow up of undifferentiated inflammatory process. She was last seen 01/05/2024 and was doing better after starting acupuncture. We noted ongoing possibility of PMR-presentation of RA in particular, but refrained from any DMARD therapy per patient preference.  Today She is doing very well since last visit with further improvement in her symptoms, no longer doing acupuncture quite as often. She estimates at most 20 minutes of morning stiffness in her left hip, but more like 5 minutes elsewhere. She denies any interval joint heat/swelling.  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 She is a never smoker. She drinks an average of 0 servings of alcohol weekly. [name] [name] reports a family history of MS and eczema in her daughter, celiac disease in her granddaughter, CRPS in her brother, parkinson's in her sister, but denies any family history of known autoimmune disease otherwise.   Review of Systems ROS was negative except as noted above.  Patient Active Problem List   Diagnosis Date Noted  . Paroxysmal atrial fibrillation with RVR (CMS/HHS-HCC) 08/22/2023  . Pulmonary emboli (CMS/HHS-HCC) 08/21/2023  . Vestibular disorder, unspecified laterality 08/15/2023  . Low back pain, episodic 06/06/2023  . Degenerative joint disease of cervical and lumbar spine 06/06/2023  . NASH (nonalcoholic steatohepatitis) 06/06/2023  . Prediabetes 06/06/2023  . Meningioma (CMS/HHS-HCC) 12/17/2022  . History of stroke 12/16/2022  . Transient ischemic attack 11/08/2022  . Bilateral hearing loss 03/28/2019  . Nephrolithiasis 03/28/2019  . Irritable bowel syndrome with diarrhea 01/13/2018  . Vitamin D deficiency 09/21/2017  . Age-related  osteoporosis without current pathological fracture 04/02/2015  . Cholelithiasis without cholecystitis 04/02/2015  . Chronic diarrhea   . Nephrolithiasis   . Bilateral hearing loss     Past Medical History:  Diagnosis Date  . Allergic state   . Arthritis   . Bilateral hearing loss    Wears hearing aids  . Cholelithiasis    asymptomatic  . Cholelithiasis without cholecystitis 04/02/2015  . Chronic diarrhea   . Environmental allergies   . Fibroids   . Hemorrhoids   . History of depression   . Hypothyroidism   . IBS (irritable bowel syndrome)   . Irritable bowel syndrome with diarrhea 01/13/2018  . Joint synovitis    Likely synovitis, second metatarsophalangeal joint  . Nephrolithiasis 2005  . Osteoporosis 04/02/2015  . Plantar fasciitis, right   . Seizure-like activity (CMS/HHS-HCC) 12/16/2022   Past Surgical History:  Procedure Laterality Date  . TONSILLECTOMY AND ADENOIDECTOMY  1958  . Excision of wisdom teeth  1965  . Right foot surgery  06/01/2006   hallux valgus, fifth metatarsal valgus and hammertoe correction  . Hardware removal  08/12/2006   Painful retained hardware, right fifth MTPJ  . COLONOSCOPY  11/27/2007   Int Hemorrhoids: CBF 11/2017; Recall Ltr mailed 10/28/2017 (dh)  . COLONOSCOPY  04/10/2018   Internal Hemorrhoids: CBF 04/2028  . Colon @ [facility]  04/18/2023   (5)Tubular adenomas/Repeat 69yrs if medically appropriate given age/SMR  . SIMPLE EXERCISE TEST(6 MIN WALK)  12/12/2023  .  dilatation and curettage     For miscarriage x3  . Bunionectomy Right   . COLONOSCOPY    . D&C, hysteroscopy, polypectomy  08/18/2006 - Dr. Janit  . JOINT  REPLACEMENT  06/04/2014 and 11/12/14   Metacarpal joint replaced in left and right thumbs  . Removal of cyst from mouth     Social History   Socioeconomic History  . Marital status: Widowed  Tobacco Use  . Smoking status: Never    Passive exposure: Never  . Smokeless tobacco: Never  Vaping Use  . Vaping status: Never Used   Substance and Sexual Activity  . Alcohol use: Not Currently    Comment: social  . Drug use: No  . Sexual activity: Not Currently    Partners: Male    Birth control/protection: None  Social History Narrative   10/31/2023   Likes/Enjoys/What fills your day?:    Military Service: None   Driving Status: Reports driving, independently   Home: One Story   Your Bedrooms is on: First Level   Fewest Steps to enter the home: 0   Other persons in the home: no one   Pets: none   Medical equipment you use daily: None   Medical equipment available in the home: None   Dental: no significant dental problems. Last screening Date: July 2024. Screening is: Up to date, Dr. Charlena Ling   Vision: Wears prescription glasses. Last screening Date: November 2024. Screening is: Up to date, Dr. Mevelyn   Hearing: Bilateral Hearing Aids. Last screening Date: October 2024. Screening is: Up to date, Dr. Herminio   Dermatology: Denies areas of concern. Last screening Date: June 2024 Screening is: Up to date, [facility]       Social Drivers of Health   Financial Resource Strain: Low Risk  (10/26/2023)   Overall Financial Resource Strain (CARDIA)   . Difficulty of Paying Living Expenses: Not hard at all  Food Insecurity: No Food Insecurity (10/26/2023)   Hunger Vital Sign   . Worried About Programme researcher, broadcasting/film/video in the Last Year: Never true   . Ran Out of Food in the Last Year: Never true  Transportation Needs: No Transportation Needs (10/26/2023)   PRAPARE - Transportation   . Lack of Transportation (Medical): No   . Lack of Transportation (Non-Medical): No  Housing Stability: Low Risk  (12/07/2023)   Housing Stability Vital Sign   . Unable to Pay for Housing in the Last Year: No   . Number of Times Moved in the Last Year: 0   . Homeless in the Last Year: No   Family History  Problem Relation Name Age of Onset  . Uterine cancer Mother  11       endometrial cancer stage 3  . Coronary Artery  Disease (Blocked arteries around heart) Mother    . Osteoarthritis Mother    . Osteoporosis (Thinning of bones) Mother    . Coronary Artery Disease (Blocked arteries around heart) Father    . Kidney failure Father    . Prostate cancer Father    . Skin cancer Father    . Osteoporosis (Thinning of bones) Sister    . Parkinsonism Sister    . Breast cancer Sister    . Osteoporosis (Thinning of bones) Sister    . No Known Problems Brother    . Colon cancer Maternal Uncle    . Breast cancer Paternal Aunt         70s  . No Known Problems Maternal Grandmother    . No Known Problems Maternal Grandfather    . Myocardial Infarction (Heart attack) Paternal Grandmother    . Breast cancer Paternal Grandmother    . Dementia  Paternal Grandmother    . Huntington's disease Paternal Grandfather      Physical Exam:  BP 126/72   Temp 36.2 C (97.2 F) (Temporal)   Ht 157.5 cm (5' 2)   Wt 67.1 kg (148 lb)   LMP  (LMP Unknown)   BMI 27.07 kg/m   General: Pleasant elderly white woman sitting in chair. Well groomed, no acute distress. Non-toxic appearance.  HEENT: Conjunctivae normal.  Pulmonary: Normal effort of breathing. Symmetric chest expansion.  Musculoskeletal: Tenderness to palpation of few scattered PIPs without synovitis, as well as both medial knees. Fist formation unimpaired bilaterally. Grip strength appropriate and equal. Negative squeeze tests to hands and feet. No other tender, synovitic, warm, erythematous, or deformed joints. FROM all joints except as above.  Skin: Skin warm and dry. No rashes appreciable. Neurologic: Oriented to time, person, place, and situation. Normal gait.  Psychological: Normal behavior, thought content, and judgment  Records were reviewed. Office Visit on 01/05/2024  Component Date Value Ref Range Status  . C Reactive Protein - [facility] 01/05/2024 1  0 - 10 mg/L Final  . Sedimentation Rate-Automated 01/05/2024 12  0 - 30 mm/hr Final  . Color 01/05/2024  Yellow  Colorless, Straw, Light Yellow, Yellow, Dark Yellow Final  . Clarity 01/05/2024 Clear  Clear Final  . Specific Gravity 01/05/2024 1.021  1.005 - 1.030 Final  . pH, Urine 01/05/2024 5.5  5.0 - 8.0 Final  . Protein, Urinalysis 01/05/2024 Negative  Negative mg/dL Final  . Glucose, Urinalysis 01/05/2024 Negative  Negative mg/dL Final  . Ketones, Urinalysis 01/05/2024 Negative  Negative mg/dL Final  . Blood, Urinalysis 01/05/2024 Negative  Negative Final  . Nitrite, Urinalysis 01/05/2024 Negative  Negative Final  . Leukocyte Esterase, Urinalysis 01/05/2024 3+ (!)  Negative Final  . Bilirubin, Urinalysis 01/05/2024 Negative  Negative Final  . Urobilinogen, Urinalysis 01/05/2024 0.2  0.2 - 1.0 mg/dL Final  . WBC, UA 97/72/7974 5  <=5 /hpf Final  . Red Blood Cells, Urinalysis 01/05/2024 1  <=3 /hpf Final  . Bacteria, Urinalysis 01/05/2024 0-5  0 - 5 /hpf Final  . Squamous Epithelial Cells, Urinaly* 01/05/2024 1  /hpf Final  . Urine Culture, Routine - [facility] 01/05/2024 Final report   Final  . Result 1 - [facility] 01/05/2024 Comment   Final    Assessment and Plan: Polyarthralgia  (primary encounter diagnosis)  Interval history and physical quite reassuring against active rheumatologic inflammatory processes. We discussed symptom patterns to monitor for as she moves to [place]  that would prompt her to need to find a new rheumatologist for evaluation, including prolonged morning stiffness and gelling phenomenon and joint heat/swelling. Teach-back technique utilized to confirm understanding.   Questions were welcomed and answered.  Follow up as needed.     Medication List       * Accurate as of Mar 14, 2024  9:20 AM. If you have any questions, ask your nurse or doctor.          CHANGE how you take these medications    levETIRAcetam  500 mg XR tablet Commonly known as: KEPPRA  XR Take 2 tablets (1,000 mg total) by mouth at bedtime for 30 days What changed:  how much to  take additional instructions       CONTINUE taking these medications    acacia (bulk) Powd   ALLEGRA ORAL   atorvastatin  40 MG tablet Commonly known as: LIPITOR TAKE 1 TABLET BY MOUTH EVERY DAY   calcium  carbonate-vit D3-min 600 mg-10 mcg (400 unit)  Tab   cholecalciferol 1000 unit tablet   ELIQUIS  5 mg tablet Generic drug: apixaban  TAKE 1 TABLET BY MOUTH EVERY 12 HOURS   EPINEPHrine  0.3 mg/0.3 mL auto-injector Commonly known as: EPIPEN    Herbal Supplement   meclizine  25 mg tablet Commonly known as: ANTIVERT    mometasone 50 mcg/actuation nasal spray Commonly known as: NASONEX   PROBIOTIC BLEND ORAL   TYLENOL  ORAL   VITAMIN B-12 ORAL       No orders of the defined types were placed in this encounter.  *Some images could not be shown.

## 2024-05-15 ENCOUNTER — Other Ambulatory Visit: Payer: Medicare PPO

## 2024-05-15 ENCOUNTER — Inpatient Hospital Stay: Payer: Medicare PPO | Admitting: Internal Medicine

## 2024-06-01 ENCOUNTER — Inpatient Hospital Stay: Admitting: Internal Medicine

## 2024-06-01 ENCOUNTER — Inpatient Hospital Stay: Attending: Internal Medicine

## 2024-06-01 ENCOUNTER — Encounter: Payer: Self-pay | Admitting: Internal Medicine

## 2024-06-01 VITALS — BP 115/60 | HR 78 | Temp 98.9°F | Resp 18 | Ht 63.0 in | Wt 149.0 lb

## 2024-06-01 DIAGNOSIS — Z7901 Long term (current) use of anticoagulants: Secondary | ICD-10-CM | POA: Diagnosis not present

## 2024-06-01 DIAGNOSIS — Z8673 Personal history of transient ischemic attack (TIA), and cerebral infarction without residual deficits: Secondary | ICD-10-CM | POA: Insufficient documentation

## 2024-06-01 DIAGNOSIS — Z86711 Personal history of pulmonary embolism: Secondary | ICD-10-CM | POA: Diagnosis present

## 2024-06-01 DIAGNOSIS — I2782 Chronic pulmonary embolism: Secondary | ICD-10-CM | POA: Diagnosis not present

## 2024-06-01 DIAGNOSIS — Z79899 Other long term (current) drug therapy: Secondary | ICD-10-CM | POA: Insufficient documentation

## 2024-06-01 DIAGNOSIS — I2699 Other pulmonary embolism without acute cor pulmonale: Secondary | ICD-10-CM

## 2024-06-01 LAB — CBC WITH DIFFERENTIAL (CANCER CENTER ONLY)
Abs Immature Granulocytes: 0.01 K/uL (ref 0.00–0.07)
Basophils Absolute: 0 K/uL (ref 0.0–0.1)
Basophils Relative: 1 %
Eosinophils Absolute: 0.1 K/uL (ref 0.0–0.5)
Eosinophils Relative: 1 %
HCT: 41.9 % (ref 36.0–46.0)
Hemoglobin: 13.7 g/dL (ref 12.0–15.0)
Immature Granulocytes: 0 %
Lymphocytes Relative: 28 %
Lymphs Abs: 2.4 K/uL (ref 0.7–4.0)
MCH: 31.6 pg (ref 26.0–34.0)
MCHC: 32.7 g/dL (ref 30.0–36.0)
MCV: 96.5 fL (ref 80.0–100.0)
Monocytes Absolute: 0.4 K/uL (ref 0.1–1.0)
Monocytes Relative: 5 %
Neutro Abs: 5.8 K/uL (ref 1.7–7.7)
Neutrophils Relative %: 65 %
Platelet Count: 260 K/uL (ref 150–400)
RBC: 4.34 MIL/uL (ref 3.87–5.11)
RDW: 13 % (ref 11.5–15.5)
WBC Count: 8.8 K/uL (ref 4.0–10.5)
nRBC: 0 % (ref 0.0–0.2)

## 2024-06-01 LAB — BASIC METABOLIC PANEL - CANCER CENTER ONLY
Anion gap: 9 (ref 5–15)
BUN: 27 mg/dL — ABNORMAL HIGH (ref 8–23)
CO2: 27 mmol/L (ref 22–32)
Calcium: 8.9 mg/dL (ref 8.9–10.3)
Chloride: 103 mmol/L (ref 98–111)
Creatinine: 0.89 mg/dL (ref 0.44–1.00)
GFR, Estimated: >60 mL/min (ref >60)
Glucose, Bld: 165 mg/dL — ABNORMAL HIGH (ref 70–99)
Potassium: 4.8 mmol/L (ref 3.5–5.1)
Sodium: 139 mmol/L (ref 135–145)

## 2024-06-01 NOTE — Assessment & Plan Note (Addendum)
 OCT 13th, 2024-[right upper quadrant pain ] CTA- Examination is positive for acute pulmonary embolism with bilateral segmental pulmonary artery filling defects within the lower lobes. RV to LV ratio is equal to 1.22. s/p Mechanical thrombectomy -[Dr.Dew]- currently on Eliquis  [PCP]. Lower extremity venous Dopplers: negative.  Tolerating well.  Hypercoagulable workup [December 2024]-negative for factor V Leiden/prothrombin gene mutation/Antithrombin III  protein C and protein S; antiphospholipid antibody workup negative.   # Given the unprovoked nature of PE -discussed that the length of anticoagulation would be 6 to 12 months [history of nosebleeds on Plavix]; Discussed that the duration of anticoagulation could also be indefinite as no identifiable cause was noted at this time- after finishing current dose  Consider decreasing the dose to 2.5 mg twice daily Eliquis  after 1 year.   # TIA/Stroke [in JAN 2024]-with out residual weakness/ Seizures-keppra  [on plaivix/asprin]-Dr.Shah on eliquis .   # DISPOSITION: # follow up as needed-  Dr.B

## 2024-06-01 NOTE — Progress Notes (Signed)
 [facility] CONSULT NOTE  Patient Care Team: Zachary Idelia LABOR, MD as PCP - General (Family Medicine) Rennie Cindy SAUNDERS, MD as Consulting Physician (Oncology)  CHIEF COMPLAINTS/PURPOSE OF CONSULTATION: DVT/PE  #  Oncology History   No history exists.   IMPRESSION: 1. Examination is positive for acute pulmonary embolism with bilateral segmental pulmonary artery filling defects within the lower lobes. RV to LV ratio is equal to 1.22. 2. Trace right pleural effusion with subsegmental atelectasis and mild subpleural consolidation within the right lower lobe. 3. Coronary artery calcification. 4. Cholelithiasis. 5. Aortic Atherosclerosis (ICD10-I70.0).   Electronically Signed: By: Waddell Calk M.D. On: 08/21/2023 11:19  HISTORY OF PRESENTING ILLNESS: Patient ambulating-independently. Alone.  [name] [name] 78 y.o.  female with prior history bilateral unprovoked PE [August 22, 2023]-status post mechanical thrombectomy-on Eliquis ; prior history of stroke-on aspirin is here for follow-up.  Moving to [place]  on the weekend. No further problems with DVT or PE.   No bleeding  noted. No falls.   Review of Systems  Constitutional:  Negative for chills, diaphoresis, fever, malaise/fatigue and weight loss.  HENT:  Negative for nosebleeds and sore throat.   Eyes:  Negative for double vision.  Respiratory:  Negative for cough, hemoptysis, sputum production, shortness of breath and wheezing.   Cardiovascular:  Negative for chest pain, palpitations, orthopnea and leg swelling.  Gastrointestinal:  Negative for abdominal pain, blood in stool, constipation, diarrhea, heartburn, melena, nausea and vomiting.  Genitourinary:  Negative for dysuria, frequency and urgency.  Musculoskeletal:  Negative for back pain and joint pain.  Skin: Negative.  Negative for itching and rash.  Neurological:  Negative for dizziness, tingling, focal weakness, weakness and headaches.   Endo/Heme/Allergies:  Does not bruise/bleed easily.  Psychiatric/Behavioral:  Negative for depression. The patient is not nervous/anxious and does not have insomnia.      MEDICAL HISTORY:  Past Medical History:  Diagnosis Date   Allergic genetic state    Arthritis    Cholelithiasis    Depression    Diarrhea    Fibroids    Hearing loss    Hemorrhoids    Hypothyroidism    IBS (irritable bowel syndrome)    Kidney stones    Osteoarthrosis    Plantar fasciitis    Seizure (HCC)    Stroke (HCC)    Synovitis    TIA (transient ischemic attack)     SURGICAL HISTORY: Past Surgical History:  Procedure Laterality Date   BREAST CYST ASPIRATION Left 12/14/2019   BUNIONECTOMY     COLONOSCOPY     COLONOSCOPY WITH PROPOFOL  N/A 04/10/2018   Procedure: COLONOSCOPY WITH PROPOFOL ;  Surgeon: Viktoria Lamar DASEN, MD;  Location: [facility] ENDOSCOPY;  Service: Endoscopy;  Laterality: N/A;   COLONOSCOPY WITH PROPOFOL  N/A 04/18/2023   Procedure: COLONOSCOPY WITH PROPOFOL ;  Surgeon: Onita Elspeth Sharper, DO;  Location: [facility] ENDOSCOPY;  Service: Gastroenterology;  Laterality: N/A;   CYST EXCISION     mouth   DILATION AND CURETTAGE OF UTERUS     EXTRACORPOREAL SHOCK WAVE LITHOTRIPSY Left 03/29/2019   Procedure: EXTRACORPOREAL SHOCK WAVE LITHOTRIPSY (ESWL);  Surgeon: Francisca Redell BROCKS, MD;  Location: [facility] ORS;  Service: Urology;  Laterality: Left;   HARDWARE REMOVAL     JOINT REPLACEMENT     PULMONARY THROMBECTOMY Bilateral 08/22/2023   Procedure: PULMONARY THROMBECTOMY;  Surgeon: Marea Selinda RAMAN, MD;  Location: [facility] INVASIVE CV LAB;  Service: Cardiovascular;  Laterality: Bilateral;   TONSILLECTOMY     WISDOM TOOTH EXTRACTION  SOCIAL HISTORY: Social History   Socioeconomic History   Marital status: Widowed    Spouse name: Not on file   Number of children: Not on file   Years of education: Not on file   Highest education level: Not on file  Occupational History   Not on file  Tobacco Use    Smoking status: Never   Smokeless tobacco: Never  Vaping Use   Vaping status: Never Used  Substance and Sexual Activity   Alcohol use: Not Currently    Alcohol/week: 1.0 standard drink of alcohol    Types: 1 Glasses of wine per week   Drug use: Never   Sexual activity: Yes  Other Topics Concern   Not on file  Social History Narrative   Not on file   Social Drivers of Health   Financial Resource Strain: Low Risk  (10/26/2023)   Received from [facility]   Overall Financial Resource Strain (CARDIA)    Difficulty of Paying Living Expenses: Not hard at all  Food Insecurity: No Food Insecurity (10/26/2023)   [facility]   Hunger Vital Sign    Within the past 12 months, you worried that your food would run out before you got the money to buy more.: Never true    Within the past 12 months, the food you bought just didn't last and you didn't have money to get more.: Never true  Transportation Needs: No Transportation Needs (10/26/2023)   Received from [facility] - Transportation    In the past 12 months, has lack of transportation kept you from medical appointments or from getting medications?: No    Lack of Transportation (Non-Medical): No  Physical Activity: Not on file  Stress: Not on file  Social Connections: Not on file  Intimate Partner Violence: Not At Risk (10/19/2023)   Humiliation, Afraid, Rape, and Kick questionnaire    Fear of Current or Ex-Partner: No    Emotionally Abused: No    Physically Abused: No    Sexually Abused: No    FAMILY HISTORY: Family History  Problem Relation Age of Onset   Ovarian cancer Mother    Clotting disorder Mother    Kidney failure Father    Breast cancer Sister 55   Clotting disorder Brother    Breast cancer Maternal Grandmother        Late 80's    ALLERGIES:  is allergic to prednisone, augmentin [amoxicillin-pot clavulanate], codeine, levofloxacin,  omnicef [cefdinir], trimethoprim , erythromycin, and sulfamethoxazole -trimethoprim .  MEDICATIONS:  Current Outpatient Medications  Medication Sig Dispense Refill   ACACIA SYRUP PO 1.5 tsp per day     apixaban  (ELIQUIS ) 5 MG TABS tablet Take 2 tablets (10 mg total) by mouth 2 (two) times daily for 6 days, THEN 1 tablet (5 mg total) 2 (two) times daily. 84 tablet 0   atorvastatin  (LIPITOR) 40 MG tablet Take 1 tablet by mouth daily.     Calcium  Carbonate-Vit D-Min (CALCIUM  600+D PLUS MINERALS) 600-400 MG-UNIT TABS Take 1 tablet by mouth daily.     Cholecalciferol (VITAMIN D3) 50 MCG (2000 UT) capsule Take 2,000 Units by mouth in the morning and at bedtime.     Cyanocobalamin (VITAMIN B-12 CR) 1000 MCG TBCR Take 1 tablet by mouth daily.     EPINEPHrine  0.3 mg/0.3 mL IJ SOAJ injection Inject 0.3 mg into the muscle as needed for anaphylaxis.     fexofenadine (ALLEGRA) 180 MG tablet Take 180 mg  by mouth daily.     meclizine  (ANTIVERT ) 12.5 MG tablet Take 1 tablet (12.5 mg total) by mouth 3 (three) times daily as needed for up to 9 doses for dizziness. 9 tablet 0   mometasone (NASONEX) 50 MCG/ACT nasal spray Place 2 sprays into the nose daily.     NONFORMULARY OR COMPOUNDED ITEM Inject into the muscle once a week. Compounded allergy shot.     zoledronic acid (RECLAST) 5 MG/100ML SOLN injection Inject into the vein. Every 12 months     No current facility-administered medications for this visit.       PHYSICAL EXAMINATION:  Vitals:   06/01/24 1440  BP: 115/60  Pulse: 78  Resp: 18  Temp: 98.9 F (37.2 C)  SpO2: 98%     Filed Weights   06/01/24 1440  Weight: 149 lb (67.6 kg)      Physical Exam Vitals and nursing note reviewed.  HENT:     Head: Normocephalic and atraumatic.     Mouth/Throat:     Pharynx: Oropharynx is clear.  Eyes:     Extraocular Movements: Extraocular movements intact.     Pupils: Pupils are equal, round, and reactive to light.  Cardiovascular:     Rate and  Rhythm: Normal rate and regular rhythm.  Pulmonary:     Comments: Decreased breath sounds bilaterally.  Abdominal:     Palpations: Abdomen is soft.  Musculoskeletal:        General: Normal range of motion.     Cervical back: Normal range of motion.  Skin:    General: Skin is warm.  Neurological:     General: No focal deficit present.     Mental Status: She is alert and oriented to person, place, and time.  Psychiatric:        Behavior: Behavior normal.        Judgment: Judgment normal.      LABORATORY DATA:  I have reviewed the data as listed Lab Results  Component Value Date   WBC 8.8 06/01/2024   HGB 13.7 06/01/2024   HCT 41.9 06/01/2024   MCV 96.5 06/01/2024   PLT 260 06/01/2024   Recent Labs    08/21/23 0856 08/22/23 0535 09/08/23 0911 09/08/23 1223 06/01/24 1425  NA 137 137 140  --  139  K 3.7 4.0 3.3*  --  4.8  CL 101 102 105  --  103  CO2 24 25 24   --  27  GLUCOSE 120* 106* 113*  --  165*  BUN 14 15 14   --  27*  CREATININE 0.82 0.88 0.67  --  0.89  CALCIUM  8.7* 8.0* 9.0  --  8.9  GFRNONAA >60 >60 >60  --  >60  PROT 7.9 6.6  --  6.8  --   ALBUMIN 4.3 3.4*  --  3.7  --   AST 32 25  --  27  --   ALT 26 19  --  18  --   ALKPHOS 82 68  --  70  --   BILITOT 1.4* 1.0  --  0.6  --   BILIDIR  --   --   --  <0.1  --   IBILI  --   --   --  NOT CALCULATED  --     RADIOGRAPHIC STUDIES: I have personally reviewed the radiological images as listed and agreed with the findings in the report. No results found.  ASSESSMENT & PLAN:   Pulmonary embolism without acute cor  pulmonale (HCC)  OCT 13th, 2024-[right upper quadrant pain ] CTA- Examination is positive for acute pulmonary embolism with bilateral segmental pulmonary artery filling defects within the lower lobes. RV to LV ratio is equal to 1.22. s/p Mechanical thrombectomy -[Dr.Dew]- currently on Eliquis  [PCP]. Lower extremity venous Dopplers: negative.  Tolerating well.  Hypercoagulable workup [December  2024]-negative for factor V Leiden/prothrombin gene mutation/Antithrombin III  protein C and protein S; antiphospholipid antibody workup negative.   # Given the unprovoked nature of PE -discussed that the length of anticoagulation would be 6 to 12 months [history of nosebleeds on Plavix]; Discussed that the duration of anticoagulation could also be indefinite as no identifiable cause was noted at this time- after finishing current dose  Consider decreasing the dose to 2.5 mg twice daily Eliquis  after 1 year.   # TIA/Stroke [in JAN 2024]-with out residual weakness/ Seizures-keppra  [on plaivix/asprin]-Dr.Shah on eliquis .   # DISPOSITION: # follow up as needed-  Dr.B      Cindy JONELLE Joe, MD 06/01/2024 2:52 PM

## 2024-06-01 NOTE — Progress Notes (Signed)
 Moving to [place]  on the weekend. No further problems with DVT. Only problem is allergies this year.
